# Patient Record
Sex: Female | Born: 1985 | Hispanic: Refuse to answer | Marital: Married
Health system: Southern US, Community
[De-identification: ages and names within clinical notes are randomized; demographics above are authoritative.]

## PROBLEM LIST (undated history)

## (undated) DIAGNOSIS — N390 Urinary tract infection, site not specified: Secondary | ICD-10-CM

## (undated) DIAGNOSIS — E559 Vitamin D deficiency, unspecified: Secondary | ICD-10-CM

## (undated) DIAGNOSIS — Z8619 Personal history of other infectious and parasitic diseases: Secondary | ICD-10-CM

## (undated) HISTORY — DX: Urinary tract infection, site not specified: N39.0

## (undated) HISTORY — DX: Vitamin D deficiency, unspecified: E55.9

## (undated) HISTORY — DX: Personal history of other infectious and parasitic diseases: Z86.19

---

## 2012-11-19 ENCOUNTER — Ambulatory Visit: Payer: Self-pay | Admitting: Family Medicine

## 2013-03-24 ENCOUNTER — Emergency Department: Payer: Self-pay | Admitting: Emergency Medicine

## 2018-07-02 ENCOUNTER — Ambulatory Visit (INDEPENDENT_AMBULATORY_CARE_PROVIDER_SITE_OTHER): Payer: No Typology Code available for payment source | Admitting: Internal Medicine

## 2018-07-02 ENCOUNTER — Encounter: Payer: Self-pay | Admitting: Internal Medicine

## 2018-07-02 VITALS — BP 124/84 | HR 75 | Temp 98.7°F | Ht 64.0 in | Wt 211.6 lb

## 2018-07-02 DIAGNOSIS — Z13228 Encounter for screening for other metabolic disorders: Secondary | ICD-10-CM

## 2018-07-02 DIAGNOSIS — E669 Obesity, unspecified: Secondary | ICD-10-CM

## 2018-07-02 DIAGNOSIS — L309 Dermatitis, unspecified: Secondary | ICD-10-CM

## 2018-07-02 DIAGNOSIS — Z3040 Encounter for surveillance of contraceptives, unspecified: Secondary | ICD-10-CM | POA: Diagnosis not present

## 2018-07-02 DIAGNOSIS — Z3046 Encounter for surveillance of implantable subdermal contraceptive: Secondary | ICD-10-CM | POA: Diagnosis not present

## 2018-07-02 DIAGNOSIS — Z1321 Encounter for screening for nutritional disorder: Secondary | ICD-10-CM

## 2018-07-02 DIAGNOSIS — Z13220 Encounter for screening for lipoid disorders: Secondary | ICD-10-CM

## 2018-07-02 DIAGNOSIS — Z1329 Encounter for screening for other suspected endocrine disorder: Secondary | ICD-10-CM

## 2018-07-02 DIAGNOSIS — E559 Vitamin D deficiency, unspecified: Secondary | ICD-10-CM

## 2018-07-02 DIAGNOSIS — Z1389 Encounter for screening for other disorder: Secondary | ICD-10-CM

## 2018-07-02 DIAGNOSIS — Z0000 Encounter for general adult medical examination without abnormal findings: Secondary | ICD-10-CM

## 2018-07-02 DIAGNOSIS — Z130 Encounter for screening for diseases of the blood and blood-forming organs and certain disorders involving the immune mechanism: Secondary | ICD-10-CM

## 2018-07-02 MED ORDER — PHENTERMINE HCL 15 MG PO TBDP: 15.0000 mg | ORAL_TABLET | ORAL | 0 refills | Status: DC

## 2018-07-02 MED ORDER — TRIAMCINOLONE ACETONIDE 0.1 % EX CREA: 1.0000 "application " | TOPICAL_CREAM | Freq: Two times a day (BID) | CUTANEOUS | 0 refills | Status: DC

## 2018-07-02 NOTE — Progress Notes (Signed)
Pre visit review using our clinic review tool, if applicable. No additional management support is needed unless otherwise documented below in the visit note. 

## 2018-07-02 NOTE — Progress Notes (Addendum)
Chief Complaint  Patient presents with  . Establish Care   New patient  1 .She needs Nexplanon out referred to ob/gyn placed 2 years ago [facility] and wants to disc contraception nexplanon due to be out 07/2018  2. 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 C/o eczema right hand small spot saw dermatology years ago   Review of Systems  Constitutional: Negative for weight loss.  HENT: Negative for hearing loss.   Eyes: Negative for blurred vision.  Respiratory: Negative for shortness of breath.   Cardiovascular: Negative for chest pain.  Gastrointestinal: Negative for abdominal pain.  Musculoskeletal: Negative for falls.  Skin: Positive for rash.  Neurological: Negative for headaches.  Psychiatric/Behavioral: Negative for depression.   Past Medical History:  Diagnosis Date  . History of chicken pox    Past Surgical History:  Procedure Laterality Date  . CESAREAN SECTION     C sec 2004 and 2011    Family History  Problem Relation Age of Onset  . Diabetes Maternal Grandmother   . Hypertension Maternal Grandmother   . Heart disease Maternal Grandfather   . Cancer Paternal Grandfather        lung smoker    Social History   Socioeconomic History  . Marital status: Married    Spouse name: Not on file  . Number of children: Not on file  . Years of education: Not on file  . Highest education level: Not on file  Occupational History  . Not on file  Social Needs  . Financial resource strain: Not on file  . Food insecurity:    Worry: Not on file    Inability: Not on file  . Transportation needs:    Medical: Not on file    Non-medical: Not on file  Tobacco Use  . Smoking status: Never Smoker  . Smokeless tobacco: Never Used  Substance and Sexual Activity  . Alcohol use: Yes  . Drug use: Not Currently  . Sexual activity: Yes    Partners:  Male  Lifestyle  . Physical activity:    Days per week: Not on file    Minutes per session: Not on file  . Stress: Not on file  Relationships  . Social connections:    Talks on phone: Not on file    Gets together: Not on file    Attends religious service: Not on file    Active member of club or organization: Not on file    Attends meetings of clubs or organizations: Not on file    Relationship status: Not on file  . Intimate partner violence:    Fear of current or ex partner: Not on file    Emotionally abused: Not on file    Physically abused: Not on file    Forced sexual activity: Not on file  Other Topics Concern  . 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   No Known Allergies No results found for this or any previous visit (from the past 2160 hour(s)). Objective  Body mass index is 36.32 kg/m. Wt Readings from Last 3 Encounters:  07/02/18 211 lb 9.6 oz (96 kg)   Temp Readings from Last 3 Encounters:  07/02/18 98.7 F (37.1 C) (Oral)   BP Readings from Last 3 Encounters:  07/02/18 124/84   Pulse Readings from Last 3 Encounters:  07/02/18 75    Physical Exam  Constitutional: She is oriented to person, place, and time. Vital signs are normal. She appears well-developed and well-nourished. She is cooperative.  HENT:  Head: Normocephalic and atraumatic.  Mouth/Throat: Oropharynx is clear and moist and mucous membranes are normal.  Eyes: Pupils are equal, round, and reactive to light. Conjunctivae are normal.  Cardiovascular: Normal rate, regular rhythm and normal heart sounds.  Pulmonary/Chest: Effort normal and breath sounds normal.    Neurological: She is alert and oriented to person, place, and time. Gait normal.  Skin: Skin is warm and dry.     Small area hypopigmented eczema right hand   Psychiatric: She has a normal mood and affect. Her speech is normal and behavior is normal. Judgment and thought content normal. Cognition and memory are normal.  Nursing note and vitals reviewed.   Assessment   1. nexplanon and disc contraception  2. Obesity BMI 36.32  3. Eczema  4. HM Plan  1. Refer ob/gyn Dr. Leonides Schanz remove and disc options  2. adipex 15 mg x 1 month call back in 1 month to see if wants to be on 37.5 dose  Will need f/u in 2 months  Healthy diet and exercise  3. tmc and hand cream rec  4.  Flu shot had 05/31/18  Tdap 2017 check [facility]  Declines STD check, MMR and hep B had with job  sch fasting labs  Pap 2 years ago [facility] get records today referred obgyn as above  -health dept cant find record   [facility]  Provider: Dr. Olivia Mackie McLean-Scocuzza-Internal Medicine

## 2018-07-02 NOTE — Patient Instructions (Addendum)
Call health dept see if willing to take out nexplanon  Dr Kari Baars [facility]   Try Biotene mouthwash for mouth dryness    Exercising to Lose Weight Exercising can help you to lose weight. In order to lose weight through exercise, you need to do vigorous-intensity exercise. You can tell that you are exercising with vigorous intensity if you are breathing very hard and fast and cannot hold a conversation while exercising. Moderate-intensity exercise helps to maintain your current weight. You can tell that you are exercising at a moderate level if you have a higher heart rate and faster breathing, but you are still able to hold a conversation. How often should I exercise? Choose an activity that you enjoy and set realistic goals. Your health care provider can help you to make an activity plan that works for you. Exercise regularly as directed by your health care provider. This may include:  Doing resistance training twice each week, such as: ? Push-ups. ? Sit-ups. ? Lifting weights. ? Using resistance bands.  Doing a given intensity of exercise for a given amount of time. Choose from these options: ? 150 minutes of moderate-intensity exercise every week. ? 75 minutes of vigorous-intensity exercise every week. ? A mix of moderate-intensity and vigorous-intensity exercise every week.  Children, pregnant women, people who are out of shape, people who are overweight, and older adults may need to consult a health care provider for individual recommendations. If you have any sort of medical condition, be sure to consult your health care provider before starting a new exercise program. What are some activities that can help me to lose weight?  Walking at a rate of at least 4.5 miles an hour.  Jogging or running at a rate of 5 miles per hour.  Biking at a rate of at least 10 miles per hour.  Lap swimming.  Roller-skating or in-line skating.  Cross-country skiing.  Vigorous competitive sports,  such as football, basketball, and soccer.  Jumping rope.  Aerobic dancing. How can I be more active in my day-to-day activities?  Use the stairs instead of the elevator.  Take a walk during your lunch break.  If you drive, park your car farther away from work or school.  If you take public transportation, get off one stop early and walk the rest of the way.  Make all of your phone calls while standing up and walking around.  Get up, stretch, and walk around every 30 minutes throughout the day. What guidelines should I follow while exercising?  Do not exercise so much that you hurt yourself, feel dizzy, or get very short of breath.  Consult your health care provider prior to starting a new exercise program.  Wear comfortable clothes and shoes with good support.  Drink plenty of water while you exercise to prevent dehydration or heat stroke. Body water is lost during exercise and must be replaced.  Work out until you breathe faster and your heart beats faster. This information is not intended to replace advice given to you by your health care provider. Make sure you discuss any questions you have with your health care provider. Document Released: 09/21/2010 Document Revised: 01/25/2016 Document Reviewed: 01/20/2014 Elsevier Interactive Patient Education  [employer].

## 2018-07-09 ENCOUNTER — Other Ambulatory Visit (INDEPENDENT_AMBULATORY_CARE_PROVIDER_SITE_OTHER): Payer: No Typology Code available for payment source

## 2018-07-09 DIAGNOSIS — Z13220 Encounter for screening for lipoid disorders: Secondary | ICD-10-CM

## 2018-07-09 DIAGNOSIS — Z1329 Encounter for screening for other suspected endocrine disorder: Secondary | ICD-10-CM | POA: Diagnosis not present

## 2018-07-09 DIAGNOSIS — Z0000 Encounter for general adult medical examination without abnormal findings: Secondary | ICD-10-CM | POA: Diagnosis not present

## 2018-07-09 DIAGNOSIS — Z1389 Encounter for screening for other disorder: Secondary | ICD-10-CM

## 2018-07-09 DIAGNOSIS — E559 Vitamin D deficiency, unspecified: Secondary | ICD-10-CM

## 2018-07-09 LAB — LIPID PANEL
Cholesterol: 143 mg/dL (ref 0–200)
HDL: 43.5 mg/dL (ref >39.00)
LDL Cholesterol: 83 mg/dL (ref 0–99)
NONHDL: 99.53
TRIGLYCERIDES: 83 mg/dL (ref 0.0–149.0)
Total CHOL/HDL Ratio: 3
VLDL: 16.6 mg/dL (ref 0.0–40.0)

## 2018-07-09 LAB — CBC WITH DIFFERENTIAL/PLATELET
BASOS ABS: 0.1 10*3/uL (ref 0.0–0.1)
Basophils Relative: 1 % (ref 0.0–3.0)
Eosinophils Absolute: 0.2 10*3/uL (ref 0.0–0.7)
Eosinophils Relative: 2 % (ref 0.0–5.0)
HEMATOCRIT: 41.6 % (ref 36.0–46.0)
Hemoglobin: 14 g/dL (ref 12.0–15.0)
LYMPHS PCT: 32.7 % (ref 12.0–46.0)
Lymphs Abs: 2.8 10*3/uL (ref 0.7–4.0)
MCHC: 33.6 g/dL (ref 30.0–36.0)
MCV: 88.4 fl (ref 78.0–100.0)
Monocytes Absolute: 0.6 10*3/uL (ref 0.1–1.0)
Monocytes Relative: 6.6 % (ref 3.0–12.0)
NEUTROS ABS: 4.9 10*3/uL (ref 1.4–7.7)
NEUTROS PCT: 57.7 % (ref 43.0–77.0)
PLATELETS: 313 10*3/uL (ref 150.0–400.0)
RBC: 4.71 Mil/uL (ref 3.87–5.11)
RDW: 12.7 % (ref 11.5–15.5)
WBC: 8.6 10*3/uL (ref 4.0–10.5)

## 2018-07-09 LAB — COMPREHENSIVE METABOLIC PANEL
ALT: 29 U/L (ref 0–35)
AST: 20 U/L (ref 0–37)
Albumin: 4.2 g/dL (ref 3.5–5.2)
Alkaline Phosphatase: 74 U/L (ref 39–117)
BILIRUBIN TOTAL: 0.5 mg/dL (ref 0.2–1.2)
BUN: 10 mg/dL (ref 6–23)
CO2: 29 meq/L (ref 19–32)
CREATININE: 0.61 mg/dL (ref 0.40–1.20)
Calcium: 9.4 mg/dL (ref 8.4–10.5)
Chloride: 105 mEq/L (ref 96–112)
GFR: 120.78 mL/min (ref >60.00)
Glucose, Bld: 99 mg/dL (ref 70–99)
Potassium: 4.6 mEq/L (ref 3.5–5.1)
Sodium: 140 mEq/L (ref 135–145)
Total Protein: 7.7 g/dL (ref 6.0–8.3)

## 2018-07-09 LAB — T4, FREE: Free T4: 0.75 ng/dL (ref 0.60–1.60)

## 2018-07-09 LAB — TSH: TSH: 0.96 u[IU]/mL (ref 0.35–4.50)

## 2018-07-09 LAB — VITAMIN D 25 HYDROXY (VIT D DEFICIENCY, FRACTURES): VITD: 20.62 ng/mL — ABNORMAL LOW (ref 30.00–100.00)

## 2018-07-09 NOTE — Addendum Note (Signed)
Addended by: Penne Lash on: 07/09/2018 09:27 AM   Modules accepted: Orders

## 2018-07-10 LAB — URINALYSIS, ROUTINE W REFLEX MICROSCOPIC
Bilirubin, UA: NEGATIVE
Glucose, UA: NEGATIVE
Ketones, UA: NEGATIVE
LEUKOCYTES UA: NEGATIVE
Nitrite, UA: NEGATIVE
PROTEIN UA: NEGATIVE
RBC, UA: NEGATIVE
Specific Gravity, UA: 1.017 (ref 1.005–1.030)
UUROB: 0.2 mg/dL (ref 0.2–1.0)
pH, UA: 8 — ABNORMAL HIGH (ref 5.0–7.5)

## 2018-08-19 ENCOUNTER — Telehealth: Payer: Self-pay | Admitting: Internal Medicine

## 2018-08-19 ENCOUNTER — Other Ambulatory Visit: Payer: Self-pay | Admitting: Internal Medicine

## 2018-08-19 DIAGNOSIS — E669 Obesity, unspecified: Secondary | ICD-10-CM

## 2018-08-19 MED ORDER — PHENTERMINE HCL 37.5 MG PO TABS: 37.5000 mg | ORAL_TABLET | Freq: Every day | ORAL | 0 refills | Status: DC

## 2018-08-19 MED ORDER — PHENTERMINE HCL 15 MG PO TBDP: 15.0000 mg | ORAL_TABLET | ORAL | 0 refills | Status: DC

## 2018-08-19 NOTE — Telephone Encounter (Signed)
Copied from CRM [phone]. Topic: Quick Communication - Rx Refill/Question >> Aug 19, 2018 10:13 AM Stephannie LiSimmons, Darnesha Diloreto L, NT wrote: Medication: Phentermine HCl 15 MG TBDP  , patient is requesting a dose age increase per her conversation with the provider   Has the patient contacted their pharmacy? no (Agent: If no, request that the patient contact the pharmacy for the refill. (Agent: If yes, when and what did the pharmacy advise?  Preferred Pharmacy (with phone number or street name [facility] [place] [address] [phone] (Phone) [phone] (Fax)    Agent: Please be advised that RX refills may take up to 3 business days. We ask that you follow-up with your pharmacy.

## 2018-08-28 NOTE — Telephone Encounter (Signed)
Informed patient there was a prescription at the pharmacy for this.

## 2018-08-28 NOTE — Telephone Encounter (Signed)
Patient is calling to check on the status of this.

## 2018-09-16 ENCOUNTER — Encounter: Payer: Self-pay | Admitting: Internal Medicine

## 2018-09-16 ENCOUNTER — Ambulatory Visit (INDEPENDENT_AMBULATORY_CARE_PROVIDER_SITE_OTHER): Payer: No Typology Code available for payment source | Admitting: Internal Medicine

## 2018-09-16 VITALS — BP 110/68 | HR 74 | Temp 98.4°F | Ht 64.0 in | Wt 203.7 lb

## 2018-09-16 DIAGNOSIS — Z0000 Encounter for general adult medical examination without abnormal findings: Secondary | ICD-10-CM | POA: Insufficient documentation

## 2018-09-16 DIAGNOSIS — L740 Miliaria rubra: Secondary | ICD-10-CM

## 2018-09-16 DIAGNOSIS — E559 Vitamin D deficiency, unspecified: Secondary | ICD-10-CM | POA: Insufficient documentation

## 2018-09-16 DIAGNOSIS — Z0001 Encounter for general adult medical examination with abnormal findings: Secondary | ICD-10-CM

## 2018-09-16 NOTE — Progress Notes (Signed)
Pre visit review using our clinic review tool, if applicable. No additional management support is needed unless otherwise documented below in the visit note. 

## 2018-09-16 NOTE — Progress Notes (Signed)
Chief Complaint  Patient presents with  . Follow-up   Annual F/u with sister today  1. Obesity with wt loss of 8 lbs tolerating adipex 37.5 mg qd  2. Vitamin D def not taking vitamin D yet  3. Saw OB/GYN 07/16/18 pap normal and nexplanon out and changed to orthotricycline/trisprintec tolerating.  4. Rash to chest due to sweating at night in her sleep    Review of Systems  Constitutional: Positive for weight loss.  HENT: Negative for hearing loss.   Eyes: Negative for blurred vision.  Respiratory: Negative for shortness of breath.   Cardiovascular: Negative for chest pain.  Gastrointestinal: Negative for abdominal pain.  Musculoskeletal: Negative for falls.  Skin: Positive for rash.  Neurological: Negative for headaches.  Psychiatric/Behavioral: Negative for depression. The patient has insomnia.    Past Medical History:  Diagnosis Date  . History of chicken pox    Past Surgical History:  Procedure Laterality Date  . CESAREAN SECTION     C sec 2004 and 2011    Family History  Problem Relation Age of Onset  . Diabetes Maternal Grandmother   . Hypertension Maternal Grandmother   . Heart disease Maternal Grandfather   . Cancer Paternal Grandfather        lung smoker    Social History   Socioeconomic History  . Marital status: Married    Spouse name: Not on file  . Number of children: Not on file  . Years of education: Not on file  . Highest education level: Not on file  Occupational History  . Not on file  Social Needs  . Financial resource strain: Not on file  . Food insecurity:    Worry: Not on file    Inability: Not on file  . Transportation needs:    Medical: Not on file    Non-medical: Not on file  Tobacco Use  . Smoking status: Never Smoker  . Smokeless tobacco: Never Used  Substance and Sexual Activity  . Alcohol use: Yes  . Drug use: Not Currently  . Sexual activity: Yes    Partners: Male  Lifestyle  . Physical activity:    Days per week: Not on  file    Minutes per session: Not on file  . Stress: Not on file  Relationships  . Social connections:    Talks on phone: Not on file    Gets together: Not on file    Attends religious service: Not on file    Active member of club or organization: Not on file    Attends meetings of clubs or organizations: Not on file    Relationship status: Not on file  . Intimate partner violence:    Fear of current or ex partner: Not on file    Emotionally abused: Not on file    Physically abused: Not on file    Forced sexual activity: Not on file  Other Topics Concern  . 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 phentermine (ADIPEX-P) 37.5 MG tablet Take 1 tablet (37.5 mg total) by mouth daily before breakfast.  . triamcinolone cream (KENALOG)  0.1 % Apply 1 application topically 2 (two) times daily. Right hand as needed   No Known Allergies Recent Results (from the past 2160 hour(s))  Urinalysis, Routine w reflex microscopic     Status: Abnormal   Collection Time: 07/09/18  9:27 AM  Result Value Ref Range   Specific Gravity, UA 1.017 1.005 - 1.030   pH, UA 8.0 (H) 5.0 - 7.5   Color, UA Yellow Yellow   Appearance Ur Clear Clear   Leukocytes, UA Negative Negative   Protein, UA Negative Negative/Trace   Glucose, UA Negative Negative   Ketones, UA Negative Negative   RBC, UA Negative Negative   Bilirubin, UA Negative Negative   Urobilinogen, Ur 0.2 0.2 - 1.0 mg/dL   Nitrite, UA Negative Negative   Microscopic Examination Comment     Comment: Microscopic not indicated and not performed.  Vitamin D (25 hydroxy)     Status: Abnormal   Collection Time: 07/09/18  9:27 AM  Result Value Ref Range   VITD 20.62 (L) 30.00 - 100.00 ng/mL  T4,  free     Status: None   Collection Time: 07/09/18  9:27 AM  Result Value Ref Range   Free T4 0.75 0.60 - 1.60 ng/dL    Comment: Specimens from patients who are undergoing biotin therapy and /or ingesting biotin supplements may contain high levels of biotin.  The higher biotin concentration in these specimens interferes with this Free T4 assay.  Specimens that contain high levels  of biotin may cause false high results for this Free T4 assay.  Please interpret results in light of the total clinical presentation of the patient.    Lipid panel     Status: None   Collection Time: 07/09/18  9:27 AM  Result Value Ref Range   Cholesterol 143 0 - 200 mg/dL    Comment: ATP III Classification       Desirable:  < 200 mg/dL               Borderline High:  200 - 239 mg/dL          High:  > = 240 mg/dL   Triglycerides 83.0 0.0 - 149.0 mg/dL    Comment: Normal:  <150 mg/dLBorderline High:  150 - 199 mg/dL   HDL 43.50 >39.00 mg/dL   VLDL 16.6 0.0 - 40.0 mg/dL   LDL Cholesterol 83 0 - 99 mg/dL   Total CHOL/HDL Ratio 3     Comment:                Men          Women1/2 Average Risk     3.4          3.3Average Risk          5.0          4.42X Average Risk          9.6          7.13X Average Risk          15.0          11.0                       NonHDL 99.53     Comment: NOTE:  Non-HDL goal should be 30 mg/dL higher than patient's LDL goal (i.e. LDL goal of < 70 mg/dL, would have non-HDL goal of < 100 mg/dL)  CBC w/Diff     Status: None  Collection Time: 07/09/18  9:27 AM  Result Value Ref Range   WBC 8.6 4.0 - 10.5 K/uL   RBC 4.71 3.87 - 5.11 Mil/uL   Hemoglobin 14.0 12.0 - 15.0 g/dL   HCT 41.6 36.0 - 46.0 %   MCV 88.4 78.0 - 100.0 fl   MCHC 33.6 30.0 - 36.0 g/dL   RDW 12.7 11.5 - 15.5 %   Platelets 313.0 150.0 - 400.0 K/uL   Neutrophils Relative % 57.7 43.0 - 77.0 %   Lymphocytes Relative 32.7 12.0 - 46.0 %   Monocytes Relative 6.6 3.0 - 12.0 %   Eosinophils Relative 2.0 0.0 - 5.0 %   Basophils  Relative 1.0 0.0 - 3.0 %   Neutro Abs 4.9 1.4 - 7.7 K/uL   Lymphs Abs 2.8 0.7 - 4.0 K/uL   Monocytes Absolute 0.6 0.1 - 1.0 K/uL   Eosinophils Absolute 0.2 0.0 - 0.7 K/uL   Basophils Absolute 0.1 0.0 - 0.1 K/uL  Comprehensive metabolic panel     Status: None   Collection Time: 07/09/18  9:27 AM  Result Value Ref Range   Sodium 140 135 - 145 mEq/L   Potassium 4.6 3.5 - 5.1 mEq/L   Chloride 105 96 - 112 mEq/L   CO2 29 19 - 32 mEq/L   Glucose, Bld 99 70 - 99 mg/dL   BUN 10 6 - 23 mg/dL   Creatinine, Ser 0.61 0.40 - 1.20 mg/dL   Total Bilirubin 0.5 0.2 - 1.2 mg/dL   Alkaline Phosphatase 74 39 - 117 U/L   AST 20 0 - 37 U/L   ALT 29 0 - 35 U/L   Total Protein 7.7 6.0 - 8.3 g/dL   Albumin 4.2 3.5 - 5.2 g/dL   Calcium 9.4 8.4 - 10.5 mg/dL   GFR 120.78 >60.00 mL/min  TSH     Status: None   Collection Time: 07/09/18  9:27 AM  Result Value Ref Range   TSH 0.96 0.35 - 4.50 uIU/mL   Objective  Body mass index is 34.97 kg/m. Wt Readings from Last 3 Encounters:  09/16/18 203 lb 11.2 oz (92.4 kg)  07/02/18 211 lb 9.6 oz (96 kg)   Temp Readings from Last 3 Encounters:  09/16/18 98.4 F (36.9 C) (Oral)  07/02/18 98.7 F (37.1 C) (Oral)   BP Readings from Last 3 Encounters:  09/16/18 110/68  07/02/18 124/84   Pulse Readings from Last 3 Encounters:  09/16/18 74  07/02/18 75    Physical Exam Vitals signs and nursing note reviewed.  Constitutional:      Appearance: Normal appearance. She is well-developed. She is obese.  HENT:     Head: Normocephalic and atraumatic.     Nose: Nose normal.     Mouth/Throat:     Mouth: Mucous membranes are moist.     Pharynx: Oropharynx is clear.  Eyes:     Pupils: Pupils are equal, round, and reactive to light.  Cardiovascular:     Rate and Rhythm: Normal rate and regular rhythm.     Heart sounds: Normal heart sounds.  Pulmonary:     Effort: Pulmonary effort is normal.     Breath sounds: Normal breath sounds.  Skin:    General: Skin is  warm and dry.  Neurological:     General: No focal deficit present.     Mental Status: She is alert and oriented to person, place, and time.     Gait: Gait normal.  Psychiatric:  Attention and Perception: Attention and perception normal.        Mood and Affect: Mood and affect normal.        Speech: Speech normal.        Behavior: Behavior normal. Behavior is cooperative.        Thought Content: Thought content normal.        Cognition and Memory: Cognition and memory normal.        Judgment: Judgment normal.     Assessment   1. Annual  2. Obesity with wt loss BMI 34.97  3. Likely heat rash to chest  Plan   1.  Flu shot had 05/31/18  Tdap ~2017 check [facility]  Declines STD check, MMR and hep B had with job Reviewed labs rec healthy diet choices and exercise  Pap [facility]/16/18 [facility] nexplanon out HPV neg neg pap  2. adipex until 11/18/2018 then 3 month break call back refills x 4 months supply after 3 month break  3. Trial of tea tree, antibacterial soap otc, benzoyl peroxide otc If does not work topical clindamycin and HC 2.5 % in future if itching  Reduce sweating    [facility]  Provider: Dr. Olivia Mackie McLean-Scocuzza-Internal Medicine

## 2018-09-16 NOTE — Patient Instructions (Addendum)
Vitamin D3 5000 IU daily over the counter   Call back summer June or July 2020 if you want to resume Adipex  Tea tree, Cetaphil or Cerave antibacterial soaps liquid or bar  Or OTC benzoyl wash  -if not better call me back   Vitamin D Deficiency Vitamin D deficiency is when your body does not have enough vitamin D. Vitamin D is important to your body for many reasons:  It helps the body to absorb two important minerals, called calcium and phosphorus.  It plays a role in bone health.  It may help to prevent some diseases, such as diabetes and multiple sclerosis.  It plays a role in muscle function, including heart function. You can get vitamin D by:  Eating foods that naturally contain vitamin D.  Eating or drinking milk or other dairy products that have vitamin D added to them.  Taking a vitamin D supplement or a multivitamin supplement that contains vitamin D.  Being in the sun. Your body naturally makes vitamin D when your skin is exposed to sunlight. Your body changes the sunlight into a form of the vitamin that the body can use. If vitamin D deficiency is severe, it can cause a condition in which your bones become soft. In adults, this condition is called osteomalacia. In children, this condition is called rickets. What are the causes? Vitamin D deficiency may be caused by:  Not eating enough foods that contain vitamin D.  Not getting enough sun exposure.  Having certain digestive system diseases that make it difficult for your body to absorb vitamin D. These diseases include Crohn disease, chronic pancreatitis, and cystic fibrosis.  Having a surgery in which a part of the stomach or a part of the small intestine is removed.  Being obese.  Having chronic kidney disease or liver disease. What increases the risk? This condition is more likely to develop in:  Older people.  People who do not spend much time outdoors.  People who live in a long-term care  facility.  People who have had broken bones.  People with weak or thin bones (osteoporosis).  People who have a disease or condition that changes how the body absorbs vitamin D.  People who have dark skin.  People who take certain medicines, such as steroid medicines or certain seizure medicines.  People who are overweight or obese. What are the signs or symptoms? In mild cases of vitamin D deficiency, there may not be any symptoms. If the condition is severe, symptoms may include:  Bone pain.  Muscle pain.  Falling often.  Broken bones caused by a minor injury. How is this diagnosed? This condition is usually diagnosed with a blood test. How is this treated? Treatment for this condition may depend on what caused the condition. Treatment options include:  Taking vitamin D supplements.  Taking a calcium supplement. Your health care provider will suggest what dose is best for you. Follow these instructions at home:  Take medicines and supplements only as told by your health care provider.  Eat foods that contain vitamin D. Choices include: ? Fortified dairy products, cereals, or juices. Fortified means that vitamin D has been added to the food. Check the label on the package to be sure. ? Fatty fish, such as salmon or trout. ? Eggs. ? Oysters.  Do not use a tanning bed.  Maintain a healthy weight. Lose weight, if needed.  Keep all follow-up visits as told by your health care provider. This is important.  Contact a health care provider if:  Your symptoms do not go away.  You feel like throwing up (nausea) or you throw up (vomit).  You have fewer bowel movements than usual or it is difficult for you to have a bowel movement (constipation). This information is not intended to replace advice given to you by your health care provider. Make sure you discuss any questions you have with your health care provider. Document Released: 11/11/2011 Document Revised: 01/31/2016  Document Reviewed: 01/04/2015 Elsevier Interactive Patient Education  2019 [employer].

## 2018-09-23 ENCOUNTER — Ambulatory Visit (INDEPENDENT_AMBULATORY_CARE_PROVIDER_SITE_OTHER): Payer: No Typology Code available for payment source | Admitting: Internal Medicine

## 2018-09-23 ENCOUNTER — Encounter: Payer: Self-pay | Admitting: Internal Medicine

## 2018-09-23 VITALS — BP 112/64 | HR 82 | Temp 98.4°F | Ht 64.0 in | Wt 200.8 lb

## 2018-09-23 DIAGNOSIS — R6889 Other general symptoms and signs: Secondary | ICD-10-CM | POA: Diagnosis not present

## 2018-09-23 DIAGNOSIS — R112 Nausea with vomiting, unspecified: Secondary | ICD-10-CM | POA: Diagnosis not present

## 2018-09-23 DIAGNOSIS — K529 Noninfective gastroenteritis and colitis, unspecified: Secondary | ICD-10-CM

## 2018-09-23 LAB — POC INFLUENZA A&B (BINAX/QUICKVUE)
INFLUENZA A, POC: NEGATIVE
Influenza B, POC: NEGATIVE

## 2018-09-23 MED ORDER — ONDANSETRON HCL 4 MG PO TABS: 4.0000 mg | ORAL_TABLET | Freq: Three times a day (TID) | ORAL | 0 refills | Status: DC | PRN

## 2018-09-23 NOTE — Patient Instructions (Signed)
Call back by Friday if not better   Nausea, Adult Nausea is the feeling that you have an upset stomach or that you are about to vomit. Nausea on its own is not usually a serious concern, but it may be an early sign of a more serious medical problem. As nausea gets worse, it can lead to vomiting. If vomiting develops, or if you are not able to drink enough fluids, you are at risk of becoming dehydrated. Dehydration can make you tired and thirsty, cause you to have a dry mouth, and decrease how often you urinate. Older adults and people with other diseases or a weak disease-fighting system (immune system) are at higher risk for dehydration. The main goals of treating your nausea are:  To relieve your nausea.  To limit repeated nausea episodes.  To prevent vomiting and dehydration. Follow these instructions at home: Watch your symptoms for any changes. Tell your health care provider about them. Follow these instructions as told by your health care provider. Eating and drinking      Take an oral rehydration solution (ORS). This is a drink that is sold at pharmacies and retail stores.  Drink clear fluids slowly and in small amounts as you are able. Clear fluids include water, ice chips, low-calorie sports drinks, and fruit juice that has water added (diluted fruit juice).  Eat bland, easy-to-digest foods in small amounts as you are able. These foods include bananas, applesauce, rice, lean meats, toast, and crackers.  Avoid drinking fluids that contain a lot of sugar or caffeine, such as energy drinks, sports drinks, and soda.  Avoid alcohol.  Avoid spicy or fatty foods. General instructions  Take over-the-counter and prescription medicines only as told by your health care provider.  Rest at home while you recover.  Drink enough fluid to keep your urine pale yellow.  Breathe slowly and deeply when you feel nauseous.  Avoid smelling things that have strong odors.  Wash your hands  often using soap and water. If soap and water are not available, use hand sanitizer.  Make sure that all people in your household wash their hands well and often.  Keep all follow-up visits as told by your health care provider. This is important. Contact a health care provider if:  Your nausea gets worse.  Your nausea does not go away after two days.  You vomit.  You cannot drink fluids without vomiting.  You have any of the following: ? New symptoms. ? A fever. ? A headache. ? Muscle cramps. ? A rash. ? Pain while urinating.  You feel light-headed or dizzy. Get help right away if:  You have pain in your chest, neck, arm, or jaw.  You feel extremely weak or you faint.  You have vomit that is bright red or looks like coffee grounds.  You have bloody or black stools or stools that look like tar.  You have a severe headache, a stiff neck, or both.  You have severe pain, cramping, or bloating in your abdomen.  You have difficulty breathing or are breathing very quickly.  Your heart is beating very quickly.  Your skin feels cold and clammy.  You feel confused.  You have signs of dehydration, such as: ? Dark urine, very little urine, or no urine. ? Cracked lips. ? Dry mouth. ? Sunken eyes. ? Sleepiness. ? Weakness. These symptoms may represent a serious problem that is an emergency. Do not wait to see if the symptoms will go away. Get medical  help right away. Call your local emergency services (911 in the [place] Do not drive yourself to the hospital. Summary  Nausea is the feeling that you have an upset stomach or that you are about to vomit. Nausea on its own is not usually a serious concern, but it may be an early sign of a more serious medical problem.  If vomiting develops, or if you are not able to drink enough fluids, you are at risk of becoming dehydrated.  Follow recommendations for eating and drinking and take over-the-counter and prescription medicines  only as told by your health care provider.  Contact a health care provider right away if your symptoms worsen or you have new symptoms.  Keep all follow-up visits as told by your health care provider. This is important. This information is not intended to replace advice given to you by your health care provider. Make sure you discuss any questions you have with your health care provider. Document Released: 09/26/2004 Document Revised: 01/27/2018 Document Reviewed: 01/27/2018 Elsevier Interactive Patient Education  2019 [employer].  Food Choices to Help Relieve Diarrhea, Adult When you have diarrhea, the foods you eat and your eating habits are very important. Choosing the right foods and drinks can help:  Relieve diarrhea.  Replace lost fluids and nutrients.  Prevent dehydration. What general guidelines should I follow?  Relieving diarrhea  Choose foods with less than 2 g or .07 oz. of fiber per serving.  Limit fats to less than 8 tsp (38 g or 1.34 oz.) a day.  Avoid the following: ? Foods and beverages sweetened with high-fructose corn syrup, honey, or sugar alcohols such as xylitol, sorbitol, and mannitol. ? Foods that contain a lot of fat or sugar. ? Fried, greasy, or spicy foods. ? High-fiber grains, breads, and cereals. ? Raw fruits and vegetables.  Eat foods that are rich in probiotics. These foods include dairy products such as yogurt and fermented milk products. They help increase healthy bacteria in the stomach and intestines (gastrointestinal tract, or GI tract).  If you have lactose intolerance, avoid dairy products. These may make your diarrhea worse.  Take medicine to help stop diarrhea (antidiarrheal medicine) only as told by your health care provider. Replacing nutrients  Eat small meals or snacks every 3-4 hours.  Eat bland foods, such as white rice, toast, or baked potato, until your diarrhea starts to get better. Gradually reintroduce nutrient-rich foods  as tolerated or as told by your health care provider. This includes: ? Well-cooked protein foods. ? Peeled, seeded, and soft-cooked fruits and vegetables. ? Low-fat dairy products.  Take vitamin and mineral supplements as told by your health care provider. Preventing dehydration  Start by sipping water or a special solution to prevent dehydration (oral rehydration solution, ORS). Urine that is clear or pale yellow means that you are getting enough fluid.  Try to drink at least 8-10 cups of fluid each day to help replace lost fluids.  You may add other liquids in addition to water, such as clear juice or decaffeinated sports drinks, as tolerated or as told by your health care provider.  Avoid drinks with caffeine, such as coffee, tea, or soft drinks.  Avoid alcohol. What foods are recommended?     The items listed may not be a complete list. Talk with your health care provider about what dietary choices are best for you. Grains White rice. White, [place], or pita breads (fresh or toasted), including plain rolls, buns, or bagels. White pasta. Saltine,  soda, or graham crackers. Pretzels. Low-fiber cereal. Cooked cereals made with water (such as cornmeal, farina, or cream cereals). Plain muffins. Matzo. Melba toast. Zwieback. Vegetables Potatoes (without the skin). Most well-cooked and canned vegetables without skins or seeds. Tender lettuce. Fruits Apple sauce. Fruits canned in juice. Cooked apricots, cherries, grapefruit, peaches, pears, or plums. Fresh bananas and cantaloupe. Meats and other protein foods Baked or boiled chicken. Eggs. Tofu. Fish. Seafood. Smooth nut butters. Ground or well-cooked tender beef, ham, veal, lamb, pork, or poultry. Dairy Plain yogurt, kefir, and unsweetened liquid yogurt. Lactose-free milk, buttermilk, skim milk, or soy milk. Low-fat or nonfat hard cheese. Beverages Water. Low-calorie sports drinks. Fruit juices without pulp. Strained tomato and vegetable  juices. Decaffeinated teas. Sugar-free beverages not sweetened with sugar alcohols. Oral rehydration solutions, if approved by your health care provider. Seasoning and other foods Bouillon, broth, or soups made from recommended foods. What foods are not recommended? The items listed may not be a complete list. Talk with your health care provider about what dietary choices are best for you. Grains Whole grain, whole wheat, bran, or rye breads, rolls, pastas, and crackers. Wild or brown rice. Whole grain or bran cereals. Barley. Oats and oatmeal. Corn tortillas or taco shells. Granola. Popcorn. Vegetables Raw vegetables. Fried vegetables. Cabbage, broccoli, Brussels sprouts, artichokes, baked beans, beet greens, corn, kale, legumes, peas, sweet potatoes, and yams. Potato skins. Cooked spinach and cabbage. Fruits Dried fruit, including raisins and dates. Raw fruits. Stewed or dried prunes. Canned fruits with syrup. Meat and other protein foods Fried or fatty meats. Deli meats. Chunky nut butters. Nuts and seeds. Beans and lentils. Tomasa Blase. Hot dogs. Sausage. Dairy High-fat cheeses. Whole milk, chocolate milk, and beverages made with milk, such as milk shakes. Half-and-half. Cream. sour cream. Ice cream. Beverages Caffeinated beverages (such as coffee, tea, soda, or energy drinks). Alcoholic beverages. Fruit juices with pulp. Prune juice. Soft drinks sweetened with high-fructose corn syrup or sugar alcohols. High-calorie sports drinks. Fats and oils Butter. Cream sauces. Margarine. Salad oils. Plain salad dressings. Olives. Avocados. Mayonnaise. Sweets and desserts Sweet rolls, doughnuts, and sweet breads. Sugar-free desserts sweetened with sugar alcohols such as xylitol and sorbitol. Seasoning and other foods Honey. Hot sauce. Chili powder. Gravy. Cream-based or milk-based soups. Pancakes and waffles. Summary  When you have diarrhea, the foods you eat and your eating habits are very  important.  Make sure you get at least 8-10 cups of fluid each day, or enough to keep your urine clear or pale yellow.  Eat bland foods and gradually reintroduce healthy, nutrient-rich foods as tolerated, or as told by your health care provider.  Avoid high-fiber, fried, greasy, or spicy foods. This information is not intended to replace advice given to you by your health care provider. Make sure you discuss any questions you have with your health care provider. Document Released: 11/09/2003 Document Revised: 08/16/2016 Document Reviewed: 08/16/2016 Elsevier Interactive Patient Education  2019 [employer].

## 2018-09-23 NOTE — Progress Notes (Signed)
Pre visit review using our clinic review tool, if applicable. No additional management support is needed unless otherwise documented below in the visit note. 

## 2018-09-28 ENCOUNTER — Encounter: Payer: Self-pay | Admitting: Internal Medicine

## 2018-09-28 NOTE — Progress Notes (Signed)
Chief Complaint  Patient presents with  . Nausea  . Emesis   Acute visit  1. Nausea/vomiting worse since Monday prior to visit and feels dehydrated, chills, dizzy. She reports she ate a crab meal at a restaurant in [place] Saturday. She also developed h/a Monday and Tuesday, felt fatigue as well and vomited 1 x Monday. Her thirst has increased. She had projective vomiting at the same time having a stool and heart was racing and she felt pale and weak. She tried pedialyte and water and drank 2 pedialytes. She tried Tylenol 2 pills x 3 times on Tuesday which helped h/a and she reports reduced po intake. No sore throat of body aches. Monday she had abdominal pain which is now resolved. No fever. She tried to go to work today but had to leave early and come to the doctor due to not feeling well.     Review of Systems  Constitutional: Positive for chills and malaise/fatigue. Negative for fever.  HENT: Negative for hearing loss.   Eyes: Negative for blurred vision.  Respiratory: Negative for shortness of breath.   Cardiovascular: Positive for palpitations. Negative for chest pain.  Gastrointestinal: Positive for abdominal pain, nausea and vomiting.  Musculoskeletal: Negative for falls.  Neurological: Positive for dizziness and headaches.  Psychiatric/Behavioral: Negative for memory loss.   Past Medical History:  Diagnosis Date  . History of chicken pox   . Vitamin D deficiency    Past Surgical History:  Procedure Laterality Date  . CESAREAN SECTION     C sec 2004 and 2011    Family History  Problem Relation Age of Onset  . Diabetes Maternal Grandmother   . Hypertension Maternal Grandmother   . Heart disease Maternal Grandfather   . Cancer Paternal Grandfather        lung smoker    Social History   Socioeconomic History  . Marital status: Married    Spouse name: Not on file  . Number of children: Not on file  . Years of education: Not on file  . Highest education level: Not on file   Occupational History  . Not on file  Social Needs  . Financial resource strain: Not on file  . Food insecurity:    Worry: Not on file    Inability: Not on file  . Transportation needs:    Medical: Not on file    Non-medical: Not on file  Tobacco Use  . Smoking status: Never Smoker  . Smokeless tobacco: Never Used  Substance and Sexual Activity  . Alcohol use: Yes  . Drug use: Not Currently  . Sexual activity: Yes    Partners: Male  Lifestyle  . Physical activity:    Days per week: Not on file    Minutes per session: Not on file  . Stress: Not on file  Relationships  . Social connections:    Talks on phone: Not on file    Gets together: Not on file    Attends religious service: Not on file    Active member of club or organization: Not on file    Attends meetings of clubs or organizations: Not on file    Relationship status: Not on file  . Intimate partner violence:    Fear of current or ex partner: Not on file    Emotionally abused: Not on file    Physically abused: Not on file    Forced sexual activity: Not on file  Other Topics Concern  . 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 Norgestimate-Ethinyl Estradiol Triphasic (TRI-SPRINTEC) 0.18/0.215/0.25 MG-35 MCG tablet Take 1 tablet by mouth daily.  . phentermine (ADIPEX-P) 37.5 MG tablet Take 1 tablet (37.5 mg total) by mouth daily before breakfast.  . triamcinolone cream (KENALOG) 0.1 % Apply 1 application topically 2 (two) times daily. Right hand as needed   No Known Allergies Recent Results (from the past 2160 hour(s))  Urinalysis, Routine w reflex microscopic     Status: Abnormal   Collection Time: 07/09/18  9:27 AM  Result Value Ref Range   Specific  Gravity, UA 1.017 1.005 - 1.030   pH, UA 8.0 (H) 5.0 - 7.5   Color, UA Yellow Yellow   Appearance Ur Clear Clear   Leukocytes, UA Negative Negative   Protein, UA Negative Negative/Trace   Glucose, UA Negative Negative   Ketones, UA Negative Negative   RBC, UA Negative Negative   Bilirubin, UA Negative Negative   Urobilinogen, Ur 0.2 0.2 - 1.0 mg/dL   Nitrite, UA Negative Negative   Microscopic Examination Comment     Comment: Microscopic not indicated and not performed.  Vitamin D (25 hydroxy)     Status: Abnormal   Collection Time: 07/09/18  9:27 AM  Result Value Ref Range   VITD 20.62 (L) 30.00 - 100.00 ng/mL  T4, free     Status: None   Collection Time: 07/09/18  9:27 AM  Result Value Ref Range   Free T4 0.75 0.60 - 1.60 ng/dL    Comment: Specimens from patients who are undergoing biotin therapy and /or ingesting biotin supplements may contain high levels of biotin.  The higher biotin concentration in these specimens interferes with this Free T4 assay.  Specimens that contain high levels  of biotin may cause false high results for this Free T4 assay.  Please interpret results in light of the total clinical presentation of the patient.    Lipid panel     Status: None   Collection Time: 07/09/18  9:27 AM  Result Value Ref Range   Cholesterol 143 0 - 200 mg/dL    Comment: ATP III Classification       Desirable:  < 200 mg/dL               Borderline High:  200 - 239 mg/dL          High:  > = 161240 mg/dL   Triglycerides 09.683.0 0.0 - 149.0 mg/dL    Comment: Normal:  <045<150 mg/dLBorderline High:  150 - 199 mg/dL   HDL 40.9843.50 >11.91>39.00 mg/dL   VLDL 47.816.6 0.0 - 29.540.0 mg/dL   LDL Cholesterol 83 0 - 99 mg/dL   Total CHOL/HDL Ratio 3     Comment:                Men          Women1/2 Average Risk     3.4          3.3Average Risk          5.0          4.42X Average Risk  9.6          7.13X Average Risk          15.0          11.0                       NonHDL 99.53     Comment: NOTE:  Non-HDL  goal should be 30 mg/dL higher than patient's LDL goal (i.e. LDL goal of < 70 mg/dL, would have non-HDL goal of < 100 mg/dL)  CBC w/Diff     Status: None   Collection Time: 07/09/18  9:27 AM  Result Value Ref Range   WBC 8.6 4.0 - 10.5 K/uL   RBC 4.71 3.87 - 5.11 Mil/uL   Hemoglobin 14.0 12.0 - 15.0 g/dL   HCT 68.3 41.9 - 62.2 %   MCV 88.4 78.0 - 100.0 fl   MCHC 33.6 30.0 - 36.0 g/dL   RDW 29.7 98.9 - 21.1 %   Platelets 313.0 150.0 - 400.0 K/uL   Neutrophils Relative % 57.7 43.0 - 77.0 %   Lymphocytes Relative 32.7 12.0 - 46.0 %   Monocytes Relative 6.6 3.0 - 12.0 %   Eosinophils Relative 2.0 0.0 - 5.0 %   Basophils Relative 1.0 0.0 - 3.0 %   Neutro Abs 4.9 1.4 - 7.7 K/uL   Lymphs Abs 2.8 0.7 - 4.0 K/uL   Monocytes Absolute 0.6 0.1 - 1.0 K/uL   Eosinophils Absolute 0.2 0.0 - 0.7 K/uL   Basophils Absolute 0.1 0.0 - 0.1 K/uL  Comprehensive metabolic panel     Status: None   Collection Time: 07/09/18  9:27 AM  Result Value Ref Range   Sodium 140 135 - 145 mEq/L   Potassium 4.6 3.5 - 5.1 mEq/L   Chloride 105 96 - 112 mEq/L   CO2 29 19 - 32 mEq/L   Glucose, Bld 99 70 - 99 mg/dL   BUN 10 6 - 23 mg/dL   Creatinine, Ser 9.41 0.40 - 1.20 mg/dL   Total Bilirubin 0.5 0.2 - 1.2 mg/dL   Alkaline Phosphatase 74 39 - 117 U/L   AST 20 0 - 37 U/L   ALT 29 0 - 35 U/L   Total Protein 7.7 6.0 - 8.3 g/dL   Albumin 4.2 3.5 - 5.2 g/dL   Calcium 9.4 8.4 - 74.0 mg/dL   GFR 814.48 >18.56 mL/min  TSH     Status: None   Collection Time: 07/09/18  9:27 AM  Result Value Ref Range   TSH 0.96 0.35 - 4.50 uIU/mL   Objective  Body mass index is 34.47 kg/m. Wt Readings from Last 3 Encounters:  09/23/18 200 lb 12.8 oz (91.1 kg)  09/16/18 203 lb 11.2 oz (92.4 kg)  07/02/18 211 lb 9.6 oz (96 kg)   Temp Readings from Last 3 Encounters:  09/23/18 98.4 F (36.9 C) (Oral)  09/16/18 98.4 F (36.9 C) (Oral)  07/02/18 98.7 F (37.1 C) (Oral)   BP Readings from Last 3 Encounters:  09/23/18 112/64   09/16/18 110/68  07/02/18 124/84   Pulse Readings from Last 3 Encounters:  09/23/18 82  09/16/18 74  07/02/18 75    Physical Exam Vitals signs and nursing note reviewed.  Constitutional:      General: She is awake.     Appearance: Normal appearance. She is well-developed and well-groomed. She is obese.  HENT:     Head: Normocephalic and atraumatic.     Nose: Nose  normal.     Mouth/Throat:     Mouth: Mucous membranes are moist.     Pharynx: Oropharynx is clear.  Eyes:     Conjunctiva/sclera: Conjunctivae normal.     Pupils: Pupils are equal, round, and reactive to light.  Cardiovascular:     Rate and Rhythm: Normal rate and regular rhythm.     Heart sounds: Normal heart sounds. No murmur.  Pulmonary:     Effort: Pulmonary effort is normal.     Breath sounds: Normal breath sounds.  Abdominal:     General: Bowel sounds are normal. There is no distension.     Tenderness: There is no abdominal tenderness.  Skin:    General: Skin is warm and dry.  Neurological:     General: No focal deficit present.     Mental Status: She is alert and oriented to person, place, and time. Mental status is at baseline.     Gait: Gait normal.  Psychiatric:        Attention and Perception: Attention and perception normal.        Mood and Affect: Mood and affect normal.        Speech: Speech normal.        Behavior: Behavior normal. Behavior is cooperative.        Thought Content: Thought content normal.        Cognition and Memory: Cognition and memory normal.        Judgment: Judgment normal.     Assessment   1. Likely gastroenteritis flu negative  Plan   1. Peptobismol, prn zofran, hydration, bland diet  Prn Tylenol  If not better by Friday this week, consider further w/u if not better pt to call back  Note for work   Provider: Dr. French Anaracy [facility]

## 2019-06-22 ENCOUNTER — Other Ambulatory Visit: Payer: Self-pay | Admitting: Internal Medicine

## 2019-06-22 DIAGNOSIS — E669 Obesity, unspecified: Secondary | ICD-10-CM

## 2019-06-22 NOTE — Telephone Encounter (Signed)
Requested medication (s) are due for refill today: yes Requested medication (s) are on the active medication list: yes  Last refill:  08/19/2018  Future visit scheduled: yes  Notes to clinic:  Refill cannot be delegated   Requested Prescriptions  Pending Prescriptions Disp Refills   phentermine (ADIPEX-P) 37.5 MG tablet 90 tablet 0    Sig: Take 1 tablet (37.5 mg total) by mouth daily before breakfast.     Not Delegated - Gastroenterology:  Antiobesity Agents Failed - 06/22/2019 11:27 AM      Failed - This refill cannot be delegated      Passed - Last BP in normal range    BP Readings from Last 1 Encounters:  09/23/18 112/64         Passed - Last Heart Rate in normal range    Pulse Readings from Last 1 Encounters:  09/23/18 82         Passed - Valid encounter within last 12 months    Recent Outpatient Visits          9 months ago [place] McLean-Scocuzza, Nino Glow, MD   9 months ago Annual physical exam   [place] McLean-Scocuzza, Nino Glow, MD   11 months ago Nexplanon removal   [place] McLean-Scocuzza, Nino Glow, MD      Future Appointments            In 2 months McLean-Scocuzza, Nino Glow, MD [place], [facility]

## 2019-06-22 NOTE — Telephone Encounter (Signed)
Medication Refill - Medication: phentermine (ADIPEX-P) 37.5 MG tablet   Preferred Pharmacy (with phone number or street name):  [place] [place] [phone] (Phone) [phone] (Fax)

## 2019-06-25 ENCOUNTER — Other Ambulatory Visit: Payer: Self-pay | Admitting: Internal Medicine

## 2019-06-25 DIAGNOSIS — E669 Obesity, unspecified: Secondary | ICD-10-CM

## 2019-06-25 MED ORDER — PHENTERMINE HCL 37.5 MG PO TABS: 37.5000 mg | ORAL_TABLET | Freq: Every day | ORAL | 0 refills | Status: DC

## 2019-09-15 ENCOUNTER — Telehealth: Payer: Self-pay | Admitting: Internal Medicine

## 2019-09-15 NOTE — Telephone Encounter (Signed)
I called pt twice and left vm to call ofc. °

## 2019-09-17 ENCOUNTER — Ambulatory Visit (INDEPENDENT_AMBULATORY_CARE_PROVIDER_SITE_OTHER): Payer: No Typology Code available for payment source | Admitting: Internal Medicine

## 2019-09-17 ENCOUNTER — Other Ambulatory Visit: Payer: Self-pay

## 2019-09-17 VITALS — Ht 64.0 in | Wt 200.0 lb

## 2019-09-17 DIAGNOSIS — E669 Obesity, unspecified: Secondary | ICD-10-CM | POA: Diagnosis not present

## 2019-09-17 DIAGNOSIS — E559 Vitamin D deficiency, unspecified: Secondary | ICD-10-CM | POA: Diagnosis not present

## 2019-09-17 DIAGNOSIS — F419 Anxiety disorder, unspecified: Secondary | ICD-10-CM

## 2019-09-17 DIAGNOSIS — Z1389 Encounter for screening for other disorder: Secondary | ICD-10-CM

## 2019-09-17 DIAGNOSIS — Z13220 Encounter for screening for lipoid disorders: Secondary | ICD-10-CM

## 2019-09-17 DIAGNOSIS — L309 Dermatitis, unspecified: Secondary | ICD-10-CM

## 2019-09-17 DIAGNOSIS — Z1329 Encounter for screening for other suspected endocrine disorder: Secondary | ICD-10-CM

## 2019-09-17 DIAGNOSIS — Z0000 Encounter for general adult medical examination without abnormal findings: Secondary | ICD-10-CM | POA: Diagnosis not present

## 2019-09-17 MED ORDER — PHENTERMINE HCL 37.5 MG PO TABS: 37.5000 mg | ORAL_TABLET | Freq: Every day | ORAL | 0 refills | Status: DC

## 2019-09-17 MED ORDER — TRIAMCINOLONE ACETONIDE 0.1 % EX CREA: 1.0000 "application " | TOPICAL_CREAM | Freq: Two times a day (BID) | CUTANEOUS | 0 refills | Status: DC

## 2019-09-17 NOTE — Progress Notes (Signed)
Virtual Visit via Video Note  I connected with Janan Halter   on 09/17/19 at 11:15 AM EST by a video enabled telemedicine application and verified that I am speaking with the correct person using two identifiers.  Location patient: home Location provider:work or home office Persons participating in the virtual visit: patient, provider  I discussed the limitations of evaluation and management by telemedicine and the availability of in person appointments. The patient expressed understanding and agreed to proceed.   HPI: 1. Annual doing well wt down to 200 lbs with 3 months adipex 37.5 mg qd  2. Anxiety better trying aromatherapy bath salts, prayer. 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 Vitamin D def taking 2000 iu x 3 pills and mvt advised to reduced to 4000 IU daily D3    ROS: See pertinent positives and negatives per HPI. General: weight down trying  Heent: nl hearing Cv: no chest pain  Lungs: no sob  GI: no GIB Skin: hand eczema+  MSK: no jt pain  Neuro: no h/a  Psych : anxiety improved  GU no issues tolerating ocps    Past Medical History:  Diagnosis Date  . History of chicken pox   . Vitamin D deficiency     Past Surgical History:  Procedure Laterality Date  . CESAREAN SECTION     C sec 34 and 2011     Family History  Problem Relation Age of Onset  . Diabetes Maternal Grandmother   . Hypertension Maternal Grandmother   . Heart disease Maternal Grandfather   . 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  Norgestimate-Ethinyl Estradiol Triphasic (TRI-SPRINTEC) 0.18/0.215/0.25 MG-35 MCG tablet, Take 1 tablet by mouth daily., Disp: , Rfl:  .  ondansetron  (ZOFRAN) 4 MG tablet, Take 1 tablet (4 mg total) by mouth every 8 (eight) hours as needed for nausea or vomiting., Disp: 40 tablet, Rfl: 0 .  phentermine (ADIPEX-P) 37.5 MG tablet, Take 1 tablet (37.5 mg total) by mouth daily before breakfast., Disp: 30 tablet, Rfl: 0 .  triamcinolone cream (KENALOG) 0.1 %, Apply 1 application topically 2 (two) times daily. Right hand as needed, Disp: 454 g, Rfl: 0  EXAM:  VITALS per patient if applicable:  GENERAL: alert, oriented, appears well and in no acute distress  HEENT: atraumatic, conjunttiva clear, no obvious abnormalities on inspection of external nose and ears  NECK: normal movements of the head and neck  LUNGS: on inspection no signs of respiratory distress, breathing rate appears normal, no obvious gross SOB, gasping or wheezing  CV: no obvious cyanosis  MS: moves all visible extremities without noticeable abnormality  PSYCH/NEURO: pleasant and cooperative, no obvious depression or anxiety, speech and thought processing grossly intact  ASSESSMENT AND PLAN:  Discussed the following assessment and plan:  Annual physical exam -  Flu shot had likely at work hospital  Tdap ~2017 check [facility]  Had 1/2 covid vxs 09/2019  Declines STD check, MMR and hep B had with job Reviewed labs rec healthy diet choices and exercise  Pap 07/16/18 [facility]  Neg neg HPV  on OCP prev had nexplanon rec meditation, aromatherapy for anxiety which is now improved  rec D3 4000 IU with MVT vitafusion qd    Eczema of right hand - Plan: triamcinolone cream (KENALOG) 0.1 %  Obesity (BMI 30-39.9) - Plan: phentermine (ADIPEX-P) 37.5 MG tablet x 1 more month then will need 3 month break before another 4 month supply  -we discussed possible serious and likely etiologies, options for evaluation and workup, limitations of telemedicine visit vs in person visit, treatment, treatment risks and precautions. Pt prefers to treat via telemedicine empirically rather then risking  or undertaking an in person visit at this moment. Patient agrees to seek prompt in person care if worsening, new symptoms arise, or if is not improving with treatment.   I discussed the assessment and treatment plan with the patient. The patient was provided an opportunity to ask questions and all were answered. The patient agreed with the plan and demonstrated an understanding of the instructions.   The patient was advised to call back or seek an in-person evaluation if the symptoms worsen or if the condition fails to improve as anticipated.  Time spent 20 minutes  Delorise Jackson, MD

## 2019-10-01 ENCOUNTER — Other Ambulatory Visit (INDEPENDENT_AMBULATORY_CARE_PROVIDER_SITE_OTHER): Payer: No Typology Code available for payment source

## 2019-10-01 ENCOUNTER — Other Ambulatory Visit: Payer: Self-pay

## 2019-10-01 DIAGNOSIS — E559 Vitamin D deficiency, unspecified: Secondary | ICD-10-CM | POA: Diagnosis not present

## 2019-10-01 DIAGNOSIS — Z1329 Encounter for screening for other suspected endocrine disorder: Secondary | ICD-10-CM | POA: Diagnosis not present

## 2019-10-01 DIAGNOSIS — Z0000 Encounter for general adult medical examination without abnormal findings: Secondary | ICD-10-CM | POA: Diagnosis not present

## 2019-10-01 DIAGNOSIS — Z13220 Encounter for screening for lipoid disorders: Secondary | ICD-10-CM | POA: Diagnosis not present

## 2019-10-01 DIAGNOSIS — Z1389 Encounter for screening for other disorder: Secondary | ICD-10-CM

## 2019-10-01 LAB — TSH: TSH: 0.58 u[IU]/mL (ref 0.35–4.50)

## 2019-10-01 LAB — COMPREHENSIVE METABOLIC PANEL
ALT: 28 U/L (ref 0–35)
AST: 21 U/L (ref 0–37)
Albumin: 3.8 g/dL (ref 3.5–5.2)
Alkaline Phosphatase: 65 U/L (ref 39–117)
BUN: 7 mg/dL (ref 6–23)
CO2: 28 mEq/L (ref 19–32)
Calcium: 9 mg/dL (ref 8.4–10.5)
Chloride: 102 mEq/L (ref 96–112)
Creatinine, Ser: 0.54 mg/dL (ref 0.40–1.20)
GFR: 129.8 mL/min (ref >60.00)
Glucose, Bld: 89 mg/dL (ref 70–99)
Potassium: 4.1 mEq/L (ref 3.5–5.1)
Sodium: 137 mEq/L (ref 135–145)
Total Bilirubin: 0.4 mg/dL (ref 0.2–1.2)
Total Protein: 6.8 g/dL (ref 6.0–8.3)

## 2019-10-01 LAB — CBC WITH DIFFERENTIAL/PLATELET
Basophils Absolute: 0.1 10*3/uL (ref 0.0–0.1)
Basophils Relative: 0.8 % (ref 0.0–3.0)
Eosinophils Absolute: 0.3 10*3/uL (ref 0.0–0.7)
Eosinophils Relative: 4 % (ref 0.0–5.0)
HCT: 38.5 % (ref 36.0–46.0)
Hemoglobin: 13 g/dL (ref 12.0–15.0)
Lymphocytes Relative: 47.1 % — ABNORMAL HIGH (ref 12.0–46.0)
Lymphs Abs: 3.2 10*3/uL (ref 0.7–4.0)
MCHC: 33.7 g/dL (ref 30.0–36.0)
MCV: 86.3 fl (ref 78.0–100.0)
Monocytes Absolute: 0.6 10*3/uL (ref 0.1–1.0)
Monocytes Relative: 8.3 % (ref 3.0–12.0)
Neutro Abs: 2.7 10*3/uL (ref 1.4–7.7)
Neutrophils Relative %: 39.8 % — ABNORMAL LOW (ref 43.0–77.0)
Platelets: 347 10*3/uL (ref 150.0–400.0)
RBC: 4.46 Mil/uL (ref 3.87–5.11)
RDW: 12.6 % (ref 11.5–15.5)
WBC: 6.7 10*3/uL (ref 4.0–10.5)

## 2019-10-01 LAB — LIPID PANEL
Cholesterol: 161 mg/dL (ref 0–200)
HDL: 52.9 mg/dL (ref >39.00)
LDL Cholesterol: 87 mg/dL (ref 0–99)
NonHDL: 107.75
Total CHOL/HDL Ratio: 3
Triglycerides: 103 mg/dL (ref 0.0–149.0)
VLDL: 20.6 mg/dL (ref 0.0–40.0)

## 2019-10-01 LAB — VITAMIN D 25 HYDROXY (VIT D DEFICIENCY, FRACTURES): VITD: 27.93 ng/mL — ABNORMAL LOW (ref 30.00–100.00)

## 2019-10-12 ENCOUNTER — Encounter (INDEPENDENT_AMBULATORY_CARE_PROVIDER_SITE_OTHER): Payer: No Typology Code available for payment source | Admitting: Internal Medicine

## 2019-10-12 ENCOUNTER — Telehealth: Payer: Self-pay | Admitting: Internal Medicine

## 2019-10-12 DIAGNOSIS — F419 Anxiety disorder, unspecified: Secondary | ICD-10-CM

## 2019-10-12 NOTE — Telephone Encounter (Signed)
Pt called to let Dr. French Ana know that her anxiety is not any better and would like to start a medication

## 2019-10-13 ENCOUNTER — Other Ambulatory Visit: Payer: Self-pay | Admitting: Internal Medicine

## 2019-10-13 DIAGNOSIS — F419 Anxiety disorder, unspecified: Secondary | ICD-10-CM

## 2019-10-13 MED ORDER — HYDROXYZINE HCL 25 MG PO TABS: 25.0000 mg | ORAL_TABLET | Freq: Every evening | ORAL | 0 refills | Status: DC | PRN

## 2019-10-13 MED ORDER — CITALOPRAM HYDROBROMIDE 10 MG PO TABS: 10.0000 mg | ORAL_TABLET | Freq: Every day | ORAL | 3 refills | Status: DC

## 2019-10-13 NOTE — Telephone Encounter (Signed)
Celexa daily 10mg  in the am and add short term Hydroxyzine may work good for me.   Thank you Doctor       Visit Follow-Up Question  Shemeka, [place]  You 12 hours ago (7:07 PM)  VM Yes i agree,   Celexa daily 10mg  in the am  and add short term Hydroxyzine  may work good for me.   Thank you Doctor   You  Sharona, [place] 13 hours ago (6:23 PM)  TM Are you agreeable for a my chart consult fee for this treatment of anxiety since new medication will be started?   Options a low dose celexa daily 10 mg daily to start in the am    And   either add short term (1 of 3 options below) Hydroxyzine which is allergy medication used for anxiety/nonaddictive but can make you sleep at night or when not at work   Or   As needed xanax which can be addictive and make you sleepy   Or   Buspar which is non addictive helps with anxiety but it is taken 2-3 x per day    Let me know what you would like to do please   TMS   Pamalee Leyden T, CMA routed conversation to You 15 hours ago (4:10 PM)  [place], Shanterria Trilby  You 15 hours ago (4:00 PM)  VM Hi Dr.McLean,  We discussed my anxiety in last virtual visit.  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  Can you pls prescribe me a low dose of anti anxiety medication.  You may give me a call or if i need to make an appointment please let me know.  Thank you    A/P 1. Anxiety  celexa 10 mg and qhs atarax 25-50 mg qhs  Agreeable to fee  tms  Time 5-10 min

## 2019-10-14 MED FILL — hydrOXYzine HCL 25 MG TABS: 25 | 15 days supply | Qty: 30 | Fill #0

## 2019-10-14 MED FILL — CITALOPRAM HBR 10 MG TABLET: 10 | 90 days supply | Qty: 90 | Fill #0

## 2020-01-07 MED FILL — CITALOPRAM HBR 10 MG TABLET: 10 | 90 days supply | Qty: 90 | Fill #1

## 2020-01-07 MED FILL — TRI FEMYNOR 28 TABLET: 0.18/0.215/ | 84 days supply | Qty: 84 | Fill #0

## 2020-03-21 ENCOUNTER — Encounter: Payer: Self-pay | Admitting: Internal Medicine

## 2020-03-21 ENCOUNTER — Ambulatory Visit (INDEPENDENT_AMBULATORY_CARE_PROVIDER_SITE_OTHER): Payer: No Typology Code available for payment source | Admitting: Internal Medicine

## 2020-03-21 ENCOUNTER — Other Ambulatory Visit: Payer: Self-pay

## 2020-03-21 VITALS — BP 124/80 | HR 78 | Temp 97.9°F | Ht 64.0 in | Wt 210.8 lb

## 2020-03-21 DIAGNOSIS — R5383 Other fatigue: Secondary | ICD-10-CM

## 2020-03-21 DIAGNOSIS — E559 Vitamin D deficiency, unspecified: Secondary | ICD-10-CM

## 2020-03-21 DIAGNOSIS — R110 Nausea: Secondary | ICD-10-CM

## 2020-03-21 DIAGNOSIS — K760 Fatty (change of) liver, not elsewhere classified: Secondary | ICD-10-CM

## 2020-03-21 DIAGNOSIS — R109 Unspecified abdominal pain: Secondary | ICD-10-CM

## 2020-03-21 DIAGNOSIS — E538 Deficiency of other specified B group vitamins: Secondary | ICD-10-CM | POA: Diagnosis not present

## 2020-03-21 DIAGNOSIS — E669 Obesity, unspecified: Secondary | ICD-10-CM

## 2020-03-21 LAB — CBC WITH DIFFERENTIAL/PLATELET
Basophils Absolute: 0 10*3/uL (ref 0.0–0.1)
Basophils Relative: 0.6 % (ref 0.0–3.0)
Eosinophils Absolute: 0.2 10*3/uL (ref 0.0–0.7)
Eosinophils Relative: 1.8 % (ref 0.0–5.0)
HCT: 38.2 % (ref 36.0–46.0)
Hemoglobin: 12.9 g/dL (ref 12.0–15.0)
Lymphocytes Relative: 31.3 % (ref 12.0–46.0)
Lymphs Abs: 2.7 10*3/uL (ref 0.7–4.0)
MCHC: 33.9 g/dL (ref 30.0–36.0)
MCV: 86.4 fl (ref 78.0–100.0)
Monocytes Absolute: 0.5 10*3/uL (ref 0.1–1.0)
Monocytes Relative: 5.8 % (ref 3.0–12.0)
Neutro Abs: 5.2 10*3/uL (ref 1.4–7.7)
Neutrophils Relative %: 60.5 % (ref 43.0–77.0)
Platelets: 309 10*3/uL (ref 150.0–400.0)
RBC: 4.42 Mil/uL (ref 3.87–5.11)
RDW: 12.6 % (ref 11.5–15.5)
WBC: 8.6 10*3/uL (ref 4.0–10.5)

## 2020-03-21 LAB — COMPREHENSIVE METABOLIC PANEL
ALT: 14 U/L (ref 0–35)
AST: 14 U/L (ref 0–37)
Albumin: 3.8 g/dL (ref 3.5–5.2)
Alkaline Phosphatase: 59 U/L (ref 39–117)
BUN: 10 mg/dL (ref 6–23)
CO2: 26 mEq/L (ref 19–32)
Calcium: 9 mg/dL (ref 8.4–10.5)
Chloride: 103 mEq/L (ref 96–112)
Creatinine, Ser: 0.56 mg/dL (ref 0.40–1.20)
GFR: 124.11 mL/min (ref >60.00)
Glucose, Bld: 88 mg/dL (ref 70–99)
Potassium: 3.8 mEq/L (ref 3.5–5.1)
Sodium: 136 mEq/L (ref 135–145)
Total Bilirubin: 0.3 mg/dL (ref 0.2–1.2)
Total Protein: 7.4 g/dL (ref 6.0–8.3)

## 2020-03-21 LAB — T4, FREE: Free T4: 0.68 ng/dL (ref 0.60–1.60)

## 2020-03-21 LAB — TSH: TSH: 0.63 u[IU]/mL (ref 0.35–4.50)

## 2020-03-21 LAB — VITAMIN B12: Vitamin B-12: 145 pg/mL — ABNORMAL LOW (ref 211–911)

## 2020-03-21 LAB — VITAMIN D 25 HYDROXY (VIT D DEFICIENCY, FRACTURES): VITD: 24.43 ng/mL — ABNORMAL LOW (ref 30.00–100.00)

## 2020-03-21 LAB — POCT URINE PREGNANCY: Preg Test, Ur: NEGATIVE

## 2020-03-21 NOTE — Progress Notes (Signed)
Patient presenting with left flank discomfort. States this has been on and off for some time now. Patient works as a Engineer, civil (consulting) and is unsure if she may have strained herself. No known injuries or over exertion. No pain just discomfort.   Patient flagged: Current status:  PATIENT IS OVERDUE FOR BMI FOLLOW UP PLAN BMI is estimated to be 36.2 based on the last recorded weight and height

## 2020-03-21 NOTE — Patient Instructions (Addendum)
D3 4000 to 5000 IU daily     Abdominal Pain, Adult Pain in the abdomen (abdominal pain) can be caused by many things. Often, abdominal pain is not serious and it gets better with no treatment or by being treated at home. However, sometimes abdominal pain is serious. Your health care provider will ask questions about your medical history and do a physical exam to try to determine the cause of your abdominal pain. Follow these instructions at home:  Medicines  Take over-the-counter and prescription medicines only as told by your health care provider.  Do not take a laxative unless told by your health care provider. General instructions  Watch your condition for any changes.  Drink enough fluid to keep your urine pale yellow.  Keep all follow-up visits as told by your health care provider. This is important. Contact a health care provider if:  Your abdominal pain changes or gets worse.  You are not hungry or you lose weight without trying.  You are constipated or have diarrhea for more than 2-3 days.  You have pain when you urinate or have a bowel movement.  Your abdominal pain wakes you up at night.  Your pain gets worse with meals, after eating, or with certain foods.  You are vomiting and cannot keep anything down.  You have a fever.  You have blood in your urine. Get help right away if:  Your pain does not go away as soon as your health care provider told you to expect.  You cannot stop vomiting.  Your pain is only in areas of the abdomen, such as the right side or the left lower portion of the abdomen. Pain on the right side could be caused by appendicitis.  You have bloody or black stools, or stools that look like tar.  You have severe pain, cramping, or bloating in your abdomen.  You have signs of dehydration, such as: ? Dark urine, very little urine, or no urine. ? Cracked lips. ? Dry mouth. ? Sunken eyes. ? Sleepiness. ? Weakness.  You have trouble  breathing or chest pain. Summary  Often, abdominal pain is not serious and it gets better with no treatment or by being treated at home. However, sometimes abdominal pain is serious.  Watch your condition for any changes.  Take over-the-counter and prescription medicines only as told by your health care provider.  Contact a health care provider if your abdominal pain changes or gets worse.  Get help right away if you have severe pain, cramping, or bloating in your abdomen. This information is not intended to replace advice given to you by your health care provider. Make sure you discuss any questions you have with your health care provider. Document Revised: 12/28/2018 Document Reviewed: 12/28/2018 Elsevier Patient Education  2020 [employer].  Food Choices for Gastroesophageal Reflux Disease, Adult When you have gastroesophageal reflux disease (GERD), the foods you eat and your eating habits are very important. Choosing the right foods can help ease the discomfort of GERD. Consider working with a diet and nutrition specialist (dietitian) to help you make healthy food choices. What general guidelines should I follow?  Eating plan  Choose healthy foods low in fat, such as fruits, vegetables, whole grains, low-fat dairy products, and lean meat, fish, and poultry.  Eat frequent, small meals instead of three large meals each day. Eat your meals slowly, in a relaxed setting. Avoid bending over or lying down until 2-3 hours after eating.  Limit high-fat foods such  as fatty meats or fried foods.  Limit your intake of oils, butter, and shortening to less than 8 teaspoons each day.  Avoid the following: ? Foods that cause symptoms. These may be different for different people. Keep a food diary to keep track of foods that cause symptoms. ? Alcohol. ? Drinking large amounts of liquid with meals. ? Eating meals during the 2-3 hours before bed.  Cook foods using methods other than frying.  This may include baking, grilling, or broiling. Lifestyle  Maintain a healthy weight. Ask your health care provider what weight is healthy for you. If you need to lose weight, work with your health care provider to do so safely.  Exercise for at least 30 minutes on 5 or more days each week, or as told by your health care provider.  Avoid wearing clothes that fit tightly around your waist and chest.  Do not use any products that contain nicotine or tobacco, such as cigarettes and e-cigarettes. If you need help quitting, ask your health care provider.  Sleep with the head of your bed raised. Use a wedge under the mattress or blocks under the bed frame to raise the head of the bed. What foods are not recommended? The items listed may not be a complete list. Talk with your dietitian about what dietary choices are best for you. Grains Pastries or quick breads with added fat. [place] toast. Vegetables Deep fried vegetables. [place] fries. Any vegetables prepared with added fat. Any vegetables that cause symptoms. For some people this may include tomatoes and tomato products, chili peppers, onions and garlic, and horseradish. Fruits Any fruits prepared with added fat. Any fruits that cause symptoms. For some people this may include citrus fruits, such as oranges, grapefruit, pineapple, and lemons. Meats and other protein foods High-fat meats, such as fatty beef or pork, hot dogs, ribs, ham, sausage, salami and bacon. Fried meat or protein, including fried fish and fried chicken. Nuts and nut butters. Dairy Whole milk and chocolate milk. Sour cream. Cream. Ice cream. Cream cheese. Milk shakes. Beverages Coffee and tea, with or without caffeine. Carbonated beverages. Sodas. Energy drinks. Fruit juice made with acidic fruits (such as orange or grapefruit). Tomato juice. Alcoholic drinks. Fats and oils Butter. Margarine. Shortening. Ghee. Sweets and desserts Chocolate and cocoa. Donuts. Seasoning  and other foods Pepper. Peppermint and spearmint. Any condiments, herbs, or seasonings that cause symptoms. For some people, this may include curry, hot sauce, or vinegar-based salad dressings. Summary  When you have gastroesophageal reflux disease (GERD), food and lifestyle choices are very important to help ease the discomfort of GERD.  Eat frequent, small meals instead of three large meals each day. Eat your meals slowly, in a relaxed setting. Avoid bending over or lying down until 2-3 hours after eating.  Limit high-fat foods such as fatty meat or fried foods. This information is not intended to replace advice given to you by your health care provider. Make sure you discuss any questions you have with your health care provider. Document Revised: 12/10/2018 Document Reviewed: 08/20/2016 Elsevier Patient Education  2020 [employer].

## 2020-03-21 NOTE — Progress Notes (Signed)
Chief Complaint  Patient presents with  . Follow-up   F/u  1. C/o fatigue vit D def not consistent taking vitamin D 2. C/l left upper quadrant, mid quadrant ab pain "intercostal worse with increased anxiety at work pain 5-/7/10 intermittent nothing tried at times was associated nausea w/o vomiting. Sister felt and felt a mass at times she still feels hungry though she has eaten and she had nausea x 3 days tried prilosec otc x 2 doses with some relief. 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  HENT: Negative for hearing loss.   Eyes: Negative for blurred vision.  Respiratory: Negative for shortness of breath.   Cardiovascular: Negative for chest pain.  Gastrointestinal: Positive for abdominal pain and nausea.  Genitourinary: Negative for dysuria.  Musculoskeletal: Negative for falls.  Skin: Negative for rash.  Neurological: Negative for headaches.  Psychiatric/Behavioral: Negative for depression.   Past Medical History:  Diagnosis Date  . History of chicken pox   . Vitamin D deficiency    Past Surgical History:  Procedure Laterality Date  . CESAREAN SECTION     C sec 2004 and 2011    Family History  Problem Relation Age of Onset  . Diabetes Maternal Grandmother   . Hypertension Maternal Grandmother   . Heart disease Maternal Grandfather   . Cancer Paternal Grandfather        lung smoker    Social History   Socioeconomic History  . Marital status: Married    Spouse name: Not on file  . Number of children: Not on file  . Years of education: Not on file  . Highest education level: Not on file  Occupational History  . Not on file  Tobacco Use  . Smoking status: Never Smoker  . Smokeless tobacco: Never Used  Substance and Sexual Activity  . Alcohol use: Yes  . Drug use: Not Currently  . Sexual activity: Yes    Partners: Male  Other Topics Concern  . 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 Difficulty of Paying Living Expenses:   Food Insecurity:   . Worried About Charity fundraiser in the Last Year:   . Arboriculturist in the Last Year:   Transportation Needs:   . Film/video editor (Medical):   Marland Kitchen Lack of Transportation (Non-Medical):   Physical Activity:   . Days of Exercise per Week:   . Minutes of Exercise per Session:   Stress:   . Feeling of Stress :   Social Connections:   . Frequency of Communication with Friends and Family:   . Frequency of Social Gatherings with Friends and Family:   . Attends Religious Services:   . Active Member of Clubs or Organizations:   . Attends Archivist Meetings:   Marland Kitchen Marital Status:   Intimate Partner Violence:   . Fear of Current or Ex-Partner:   . Emotionally Abused:   Marland Kitchen Physically Abused:   . Sexually  Abused:    Current Meds  Medication Sig  . citalopram (CELEXA) 10 MG tablet Take 1 tablet (10 mg total) by mouth daily. In am  . hydrOXYzine (ATARAX/VISTARIL) 25 MG tablet Take 1-2 tablets (25-50 mg total) by mouth at bedtime as needed.  . Norgestimate-Ethinyl Estradiol Triphasic (TRI-SPRINTEC) 0.18/0.215/0.25 MG-35 MCG tablet Take 1 tablet by mouth daily.  . ondansetron (ZOFRAN) 4 MG tablet Take 1 tablet (4 mg total) by mouth every 8 (eight) hours as needed for nausea or vomiting.  . phentermine (ADIPEX-P) 37.5 MG tablet Take 1 tablet (37.5 mg total) by mouth daily before breakfast.  . triamcinolone cream (KENALOG) 0.1 % Apply 1 application topically 2 (two) times daily. Right hand as needed   No Known Allergies No results found for this or any previous visit (from the past 2160 hour(s)). Objective  Body mass  index is 36.18 kg/m. Wt Readings from Last 3 Encounters:  03/21/20 210 lb 12.8 oz (95.6 kg)  09/17/19 200 lb (90.7 kg)  09/23/18 200 lb 12.8 oz (91.1 kg)   Temp Readings from Last 3 Encounters:  03/21/20 97.9 F (36.6 C) (Oral)  09/23/18 98.4 F (36.9 C) (Oral)  09/16/18 98.4 F (36.9 C) (Oral)   BP Readings from Last 3 Encounters:  03/21/20 124/80  09/23/18 112/64  09/16/18 110/68   Pulse Readings from Last 3 Encounters:  03/21/20 78  09/23/18 82  09/16/18 74    Physical Exam Vitals and nursing note reviewed.  Constitutional:      Appearance: Normal appearance. She is well-developed and well-groomed. She is obese.  HENT:     Head: Normocephalic and atraumatic.  Eyes:     Conjunctiva/sclera: Conjunctivae normal.     Pupils: Pupils are equal, round, and reactive to light.  Cardiovascular:     Rate and Rhythm: Normal rate and regular rhythm.     Heart sounds: Normal heart sounds. No murmur heard.   Pulmonary:     Effort: Pulmonary effort is normal.     Breath sounds: Normal breath sounds.  Abdominal:     General: Abdomen is flat. Bowel sounds are normal.     Tenderness: There is abdominal tenderness in the left upper quadrant. There is no right CVA tenderness or left CVA tenderness.       Comments: LUQ LMQ moderate ttp  Skin:    General: Skin is warm and dry.  Neurological:     General: No focal deficit present.     Mental Status: She is alert and oriented to person, place, and time. Mental status is at baseline.     Gait: Gait normal.  Psychiatric:        Attention and Perception: Attention and perception normal.        Mood and Affect: Mood and affect normal.        Speech: Speech normal.        Behavior: Behavior normal. Behavior is cooperative.        Thought Content: Thought content normal.        Cognition and Memory: Cognition and memory normal.        Judgment: Judgment normal.     Assessment  Plan  Left sided abdominal pain of unknown cause -  Plan: CT Abdomen Pelvis Wo Contrast, Comprehensive metabolic panel, CBC with Differential/Platelet, Urinalysis, Routine w reflex microscopic, POCT urine pregnancy  Left sided abdominal pain - Plan: CT Abdomen Pelvis Wo Contrast, Comprehensive metabolic panel, CBC with Differential/Platelet, Urinalysis, Routine w reflex microscopic, POCT urine  pregnancy  Fatigue, unspecified type - Plan: Comprehensive metabolic panel, CBC with Differential/Platelet, TSH, T4, free, Vitamin D (25 hydroxy), Vitamin B12  Vitamin D deficiency - Plan: Vitamin D (25 hydroxy) rec D3 4000 to 5000 IU qd   B12 deficiency - Plan: Vitamin B12  Obesity (BMI 30-39.9)  Disc adipex restart vs wegovy  HM Flu shot had likely at work hospital  Tdap~2017 check [facility]  Had 2/2 covid pfizer will sent  Declines STD check, MMR and hep B had with job Reviewed labs rec healthy diet choices and exercise  Pap 07/16/18 [facility]  Neg neg HPV on OCP prev had nexplanon rec meditation, aromatherapy for anxiety which is now improved  rec D3 4000 IU with MVT vitafusion qd   Provider: Dr. Olivia Mackie McLean-[facility]ocuzza-[facility]

## 2020-03-22 ENCOUNTER — Telehealth: Payer: Self-pay | Admitting: Internal Medicine

## 2020-03-22 LAB — URINALYSIS, ROUTINE W REFLEX MICROSCOPIC
Bilirubin Urine: NEGATIVE
Glucose, UA: NEGATIVE
Hgb urine dipstick: NEGATIVE
Ketones, ur: NEGATIVE
Leukocytes,Ua: NEGATIVE
Nitrite: NEGATIVE
Protein, ur: NEGATIVE
Specific Gravity, Urine: 1.006 (ref 1.001–1.03)
pH: 7 (ref 5.0–8.0)

## 2020-03-22 NOTE — Telephone Encounter (Signed)
lft vm for pt to call ofc to sch CT. 

## 2020-03-22 NOTE — Addendum Note (Signed)
Addended by: Quentin Ore on: 03/22/2020 03:10 PM   Modules accepted: Orders

## 2020-03-22 NOTE — Telephone Encounter (Signed)
lft vm with appt date time location and instruction for CT

## 2020-03-28 ENCOUNTER — Telehealth: Payer: Self-pay | Admitting: Internal Medicine

## 2020-03-28 NOTE — Telephone Encounter (Signed)
Pt would like a call back about lab results ..  °

## 2020-03-29 ENCOUNTER — Other Ambulatory Visit: Payer: Self-pay

## 2020-03-29 ENCOUNTER — Encounter: Payer: Self-pay | Admitting: Internal Medicine

## 2020-03-29 ENCOUNTER — Ambulatory Visit
Admission: RE | Admit: 2020-03-29 | Discharge: 2020-03-29 | Disposition: A | Discharge status: Home / Self Care | Payer: No Typology Code available for payment source | Source: Ambulatory Visit | Attending: Internal Medicine | Admitting: Internal Medicine

## 2020-03-29 DIAGNOSIS — R110 Nausea: Secondary | ICD-10-CM | POA: Insufficient documentation

## 2020-03-29 DIAGNOSIS — R109 Unspecified abdominal pain: Secondary | ICD-10-CM | POA: Insufficient documentation

## 2020-03-29 DIAGNOSIS — K760 Fatty (change of) liver, not elsewhere classified: Secondary | ICD-10-CM | POA: Insufficient documentation

## 2020-03-29 MED ORDER — IOHEXOL 300 MG/ML  SOLN
100.0000 mL | Freq: Once | INTRAMUSCULAR | Status: AC | PRN
  Administered 2020-03-29: 100 mL via INTRAVENOUS

## 2020-03-29 NOTE — Telephone Encounter (Signed)
-----   Message from Bevelyn Buckles, MD sent at 03/22/2020  8:02 AM EDT ----- Vitamin D low rec D3 4000 to max 5000 IU daily otc  Liver kidneys normal  B12 low rec B12 every 30 days could be reason for fatigue -will she do it here or give to herself?  If not schedule B12 inj in office   Thyroid lab normal  Blood cts normal Urine normal

## 2020-03-29 NOTE — Telephone Encounter (Signed)
Left message to return call 

## 2020-03-30 NOTE — Telephone Encounter (Signed)
Pt was returning call 11:45

## 2020-03-30 NOTE — Telephone Encounter (Signed)
Patient informed and verbalized understanding.  Scheduled for nurse visit

## 2020-03-30 NOTE — Telephone Encounter (Signed)
Left message to return call 

## 2020-03-30 NOTE — Addendum Note (Signed)
Addended by: Quentin Ore on: 03/30/2020 04:08 PM   Modules accepted: Orders

## 2020-03-31 NOTE — Telephone Encounter (Signed)
Faxed certification process done on patient 7-21 for CT ABD &Pelvis w/o contrast

## 2020-03-31 NOTE — Telephone Encounter (Signed)
Faxed certification process done on patient 7-21 for CT ABD & Pelvis W/O C

## 2020-04-04 ENCOUNTER — Ambulatory Visit (INDEPENDENT_AMBULATORY_CARE_PROVIDER_SITE_OTHER): Payer: No Typology Code available for payment source

## 2020-04-04 ENCOUNTER — Other Ambulatory Visit: Payer: Self-pay

## 2020-04-04 DIAGNOSIS — E538 Deficiency of other specified B group vitamins: Secondary | ICD-10-CM

## 2020-04-04 MED ORDER — CYANOCOBALAMIN 1000 MCG/ML IJ SOLN
1000.0000 ug | Freq: Once | INTRAMUSCULAR | Status: AC
  Administered 2020-04-04: 1000 ug via INTRAMUSCULAR

## 2020-04-04 NOTE — Progress Notes (Signed)
Patient presented for B 12 injection to left deltoid, patient voiced no concerns nor showed any signs of distress during injection. 

## 2020-04-05 ENCOUNTER — Ambulatory Visit (INDEPENDENT_AMBULATORY_CARE_PROVIDER_SITE_OTHER): Payer: No Typology Code available for payment source | Admitting: Internal Medicine

## 2020-04-05 ENCOUNTER — Encounter: Payer: Self-pay | Admitting: Internal Medicine

## 2020-04-05 VITALS — BP 130/80 | HR 71 | Temp 98.1°F | Ht 64.0 in | Wt 209.8 lb

## 2020-04-05 DIAGNOSIS — E538 Deficiency of other specified B group vitamins: Secondary | ICD-10-CM

## 2020-04-05 DIAGNOSIS — R109 Unspecified abdominal pain: Secondary | ICD-10-CM

## 2020-04-05 DIAGNOSIS — E559 Vitamin D deficiency, unspecified: Secondary | ICD-10-CM | POA: Diagnosis not present

## 2020-04-05 DIAGNOSIS — K760 Fatty (change of) liver, not elsewhere classified: Secondary | ICD-10-CM

## 2020-04-05 NOTE — Progress Notes (Signed)
Chief Complaint  Patient presents with  . Follow-up   F/u  1. Ab pain improved after making diet changes and reduced sodas, fast food and CT negative except fatty liver will cancel GI Dr. Allen Norris for now and call back prn. Having intermittent nausea with acidic or spicy foods  2. B12 and vitamin D def on D3 5000 Iu qd and B12 weekly x 1 month then Q30 days  Review of Systems  Constitutional: Negative for weight loss.  HENT: Negative for hearing loss.   Eyes: Negative for blurred vision.  Respiratory: Negative for shortness of breath.   Cardiovascular: Negative for chest pain.  Gastrointestinal: Positive for nausea. Negative for abdominal pain.  Musculoskeletal: Negative for falls.  Skin: Negative for rash.  Neurological: Negative for headaches.   Past Medical History:  Diagnosis Date  . History of chicken pox   . Vitamin D deficiency    Past Surgical History:  Procedure Laterality Date  . CESAREAN SECTION     C sec 2004 and 2011    Family History  Problem Relation Age of Onset  . Diabetes Maternal Grandmother   . Hypertension Maternal Grandmother   . Heart disease Maternal Grandfather   . Cancer Paternal Grandfather        lung smoker    Social History   Socioeconomic History  . Marital status: Married    Spouse name: Not on file  . Number of children: Not on file  . Years of education: Not on file  . Highest education level: Not on file  Occupational History  . Not on file  Tobacco Use  . Smoking status: Never Smoker  . Smokeless tobacco: Never Used  Substance and Sexual Activity  . Alcohol use: Yes  . Drug use: Not Currently  . Sexual activity: Yes    Partners: Male  Other Topics Concern  . Not on file  Social History Narrative   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[employer]   1 kids daughter 36, son 25 as of 04/05/20   Born in [place] until 34 y.o then lived in [place] now moved [place] with husband    College ed    No guns    Wears seat belt    Safe in relationship    Married     Social Determinants of Radio broadcast assistant Strain:   . Difficulty of Paying Living Expenses:   Food Insecurity:   . Worried About Charity fundraiser in the Last Year:   . Arboriculturist in the Last Year:   Transportation Needs:   . Film/video editor (Medical):   Marland Kitchen Lack of Transportation (Non-Medical):   Physical Activity:   . Days of Exercise per Week:   . Minutes of Exercise per Session:   Stress:   . Feeling of Stress :   Social Connections:   . Frequency of Communication with Friends and Family:   . Frequency of Social Gatherings with Friends and Family:   . Attends Religious Services:   . Active Member of Clubs or Organizations:   . Attends Archivist Meetings:   Marland Kitchen Marital Status:   Intimate Partner Violence:   . Fear of Current or Ex-Partner:   . Emotionally Abused:   Marland Kitchen Physically Abused:   . Sexually Abused:    Current Meds  Medication Sig  . citalopram (CELEXA) 10 MG tablet Take 1 tablet (10 mg total) by mouth daily. In am  . hydrOXYzine (ATARAX/VISTARIL) 25 MG tablet  Take 1-2 tablets (25-50 mg total) by mouth at bedtime as needed.  . Norgestimate-Ethinyl Estradiol Triphasic (TRI-SPRINTEC) 0.18/0.215/0.25 MG-35 MCG tablet Take 1 tablet by mouth daily.  . ondansetron (ZOFRAN) 4 MG tablet Take 1 tablet (4 mg total) by mouth every 8 (eight) hours as needed for nausea or vomiting.  . phentermine (ADIPEX-P) 37.5 MG tablet Take 1 tablet (37.5 mg total) by mouth daily before breakfast.  . triamcinolone cream (KENALOG) 0.1 % Apply 1 application topically 2 (two) times daily. Right hand as needed   No Known Allergies Recent Results (from the past 2160 hour(s))  Comprehensive metabolic panel     Status: None   Collection Time: 03/21/20 11:38 AM  Result Value Ref Range   Sodium 136 135 - 145 mEq/L   Potassium 3.8 3.5 - 5.1 mEq/L   Chloride 103 96 - 112 mEq/L   CO2 26 19 - 32 mEq/L   Glucose, Bld 88 70 - 99 mg/dL   BUN 10 6 - 23 mg/dL    Creatinine, Ser 0.56 0.40 - 1.20 mg/dL   Total Bilirubin 0.3 0.2 - 1.2 mg/dL   Alkaline Phosphatase 59 39 - 117 U/L   AST 14 0 - 37 U/L   ALT 14 0 - 35 U/L   Total Protein 7.4 6.0 - 8.3 g/dL   Albumin 3.8 3.5 - 5.2 g/dL   GFR 124.11 >60.00 mL/min   Calcium 9.0 8.4 - 10.5 mg/dL  CBC with Differential/Platelet     Status: None   Collection Time: 03/21/20 11:38 AM  Result Value Ref Range   WBC 8.6 4.0 - 10.5 K/uL   RBC 4.42 3.87 - 5.11 Mil/uL   Hemoglobin 12.9 12.0 - 15.0 g/dL   HCT 38.2 36 - 46 %   MCV 86.4 78.0 - 100.0 fl   MCHC 33.9 30.0 - 36.0 g/dL   RDW 12.6 11.5 - 15.5 %   Platelets 309.0 150 - 400 K/uL   Neutrophils Relative % 60.5 43 - 77 %   Lymphocytes Relative 31.3 12 - 46 %   Monocytes Relative 5.8 3 - 12 %   Eosinophils Relative 1.8 0 - 5 %   Basophils Relative 0.6 0 - 3 %   Neutro Abs 5.2 1.4 - 7.7 K/uL   Lymphs Abs 2.7 0.7 - 4.0 K/uL   Monocytes Absolute 0.5 0 - 1 K/uL   Eosinophils Absolute 0.2 0 - 0 K/uL   Basophils Absolute 0.0 0 - 0 K/uL  TSH     Status: None   Collection Time: 03/21/20 11:38 AM  Result Value Ref Range   TSH 0.63 0.35 - 4.50 uIU/mL  T4, free     Status: None   Collection Time: 03/21/20 11:38 AM  Result Value Ref Range   Free T4 0.68 0.60 - 1.60 ng/dL    Comment: Specimens from patients who are undergoing biotin therapy and /or ingesting biotin supplements may contain high levels of biotin.  The higher biotin concentration in these specimens interferes with this Free T4 assay.  Specimens that contain high levels  of biotin may cause false high results for this Free T4 assay.  Please interpret results in light of the total clinical presentation of the patient.    Urinalysis, Routine w reflex microscopic     Status: None   Collection Time: 03/21/20 11:38 AM  Result Value Ref Range   Color, Urine YELLOW YELLOW   APPearance CLEAR CLEAR   Specific Gravity, Urine 1.006 1.001 - 1.03  pH 7.0 5.0 - 8.0   Glucose, UA NEGATIVE NEGATIVE    Bilirubin Urine NEGATIVE NEGATIVE   Ketones, ur NEGATIVE NEGATIVE   Hgb urine dipstick NEGATIVE NEGATIVE   Protein, ur NEGATIVE NEGATIVE   Nitrite NEGATIVE NEGATIVE   Leukocytes,Ua NEGATIVE NEGATIVE  Vitamin D (25 hydroxy)     Status: Abnormal   Collection Time: 03/21/20 11:38 AM  Result Value Ref Range   VITD 24.43 (L) 30.00 - 100.00 ng/mL  Vitamin B12     Status: Abnormal   Collection Time: 03/21/20 11:38 AM  Result Value Ref Range   Vitamin B-12 145 (L) 211 - 911 pg/mL  POCT urine pregnancy     Status: Normal   Collection Time: 03/21/20 12:09 PM  Result Value Ref Range   Preg Test, Ur Negative Negative   Objective  Body mass index is 36.01 kg/m. Wt Readings from Last 3 Encounters:  04/05/20 209 lb 12.8 oz (95.2 kg)  03/21/20 210 lb 12.8 oz (95.6 kg)  09/17/19 200 lb (90.7 kg)   Temp Readings from Last 3 Encounters:  04/05/20 98.1 F (36.7 C) (Oral)  03/21/20 97.9 F (36.6 C) (Oral)  09/23/18 98.4 F (36.9 C) (Oral)   BP Readings from Last 3 Encounters:  04/05/20 130/80  03/21/20 124/80  09/23/18 112/64   Pulse Readings from Last 3 Encounters:  04/05/20 71  03/21/20 78  09/23/18 82    Physical Exam Vitals and nursing note reviewed.  Constitutional:      Appearance: Normal appearance. She is well-developed and well-groomed. She is obese.  HENT:     Head: Normocephalic and atraumatic.  Eyes:     Conjunctiva/sclera: Conjunctivae normal.     Pupils: Pupils are equal, round, and reactive to light.  Cardiovascular:     Rate and Rhythm: Normal rate and regular rhythm.     Heart sounds: Normal heart sounds. No murmur heard.   Pulmonary:     Effort: Pulmonary effort is normal.     Breath sounds: Normal breath sounds.  Skin:    General: Skin is warm and dry.  Neurological:     General: No focal deficit present.     Mental Status: She is alert and oriented to person, place, and time. Mental status is at baseline.     Gait: Gait normal.  Psychiatric:         Attention and Perception: Attention and perception normal.        Mood and Affect: Mood and affect normal.        Speech: Speech normal.        Behavior: Behavior normal. Behavior is cooperative.        Thought Content: Thought content normal.        Cognition and Memory: Cognition and memory normal.        Judgment: Judgment normal.     Assessment  Plan  Fatty liver Given info  rec healthy diet and exercise   Abdominal pain, unspecified abdominal location Resolved Ct negative  Pt wants to defer GI referral for now  Given food avoidance list   B12 deficiency Vitamin D deficiency D3 5000 IU qd Cont supplementation disc intranasal B12 in future vs B12 1000 mcg qd   HM Flu shot hadlikely at work hospital Tdap~2017 check [facility] Had 2/2 covid pfizer will sent  Declines STD check, MMR and hep B had with job Fatty liver check hep B titer at work f/u check hep A and C   Reviewed labs rec  healthy diet choices and exercise  [facility] had nexplanon mammo age 54  Colonoscopy age 22   rec meditation, aromatherapy for anxiety which is now improved  rec D3 5000 IU with MVT vitafusion qd Provider: Dr. Olivia Mackie McLean-Scocuzza-Internal Medicine

## 2020-04-05 NOTE — Patient Instructions (Addendum)
Check hepatitis B titer and make sure level is greater than than 10   B12 1000 mcg daily or nasal B12 this is prescription   Tums, prilosec, or pepcid for reflux   Nonalcoholic Fatty Liver Disease Diet, Adult Nonalcoholic fatty liver disease is a condition that causes fat to build up in and around the liver. The disease makes it harder for the liver to work the way that it should. Following a healthy diet can help to keep nonalcoholic fatty liver disease under control. It can also help to prevent or improve conditions that are associated with the disease, such as heart disease, diabetes, high blood pressure, and abnormal cholesterol levels. Along with regular exercise, this diet:  Promotes weight loss.  Helps to control blood sugar levels.  Helps to improve the way that the body uses insulin. What are tips for following this plan? Reading food labels Always check food labels for:  The amount of saturated fat in a food. You should limit your intake of saturated fat. Saturated fat is found in foods that come from animals, including meat and dairy products such as butter, cheese, and whole milk.  The amount of fiber in a food. You should choose high-fiber foods such as fruits, vegetables, and whole grains. Try to get 25-30 grams (g) of fiber a day.  Cooking  When cooking, use heart-healthy oils that are high in monounsaturated fats. These include olive oil, canola oil, and avocado oil.  Limit frying or deep-frying foods. Cook foods using healthy methods such as baking, boiling, steaming, and grilling instead. Meal planning  You may want to keep track of how many calories you take in. Eating the right amount of calories will help you achieve a healthy weight. Meeting with a registered dietitian can help you get started.  Limit how often you eat takeout and fast food. These foods are usually very high in fat, salt, and sugar.  Use the glycemic index (GI) to plan your meals. The index  tells you how quickly a food will raise your blood sugar. Choose low-GI foods (GI less than 55). These foods take a longer time to raise blood sugar. A registered dietitian can help you identify foods lower on the GI scale. Lifestyle  You may want to follow a Mediterranean diet. This diet includes a lot of vegetables, lean meats or fish, whole grains, fruits, and healthy oils and fats. What foods can I eat?  Fruits Bananas. Apples. Oranges. Grapes. Papaya. Mango. Pomegranate. Kiwi. Grapefruit. Cherries. Vegetables Lettuce. Spinach. Peas. Beets. Cauliflower. Cabbage. Broccoli. Carrots. Tomatoes. Squash. Eggplant. Herbs. Peppers. Onions. Cucumbers. Brussels sprouts. Yams and sweet potatoes. Beans. Lentils. Grains Whole wheat or whole-grain foods, including breads, crackers, cereals, and pasta. Stone-ground whole wheat. Unsweetened oatmeal. Bulgur. Barley. Quinoa. Brown or wild rice. Corn or whole wheat flour tortillas. Meats and other proteins Lean meats. Poultry. Tofu. Seafood and shellfish. Dairy Low-fat or fat-free dairy products, such as yogurt, cottage cheese, or cheese. Beverages Water. Sugar-free drinks. Tea. Coffee. Low-fat or skim milk. Milk alternatives, such as soy or almond milk. Real fruit juice. Fats and oils Avocado. Canola or olive oil. Nuts and nut butters. Seeds. Seasonings and condiments Mustard. Relish. Low-fat, low-sugar ketchup and barbecue sauce. Low-fat or fat-free mayonnaise. Sweets and desserts Sugar-free sweets. The items listed above may not be a complete list of foods and beverages you can eat. Contact a dietitian for more information. What foods should I limit or avoid? Meats and other proteins Limit red meat to 1-2  times a week. Dairy [employer]. Fats and oils Palm oil and coconut oil. Fried foods. Other foods Processed foods. Foods that contain a lot of salt or sodium. Sweets and desserts Sweets that contain sugar. Beverages Sweetened drinks,  such as sweet tea, milkshakes, iced sweet drinks, and sodas. Alcohol. The items listed above may not be a complete list of foods and beverages you should avoid. Contact a dietitian for more information. Where to find more information The [employer] of Diabetes and Digestive and Kidney Diseases: [email or web address] Summary  Nonalcoholic fatty liver disease is a condition that causes fat to build up in and around the liver.  Following a healthy diet can help to keep nonalcoholic fatty liver disease under control. Your diet should be rich in fruits, vegetables, whole grains, and lean proteins.  Limit your intake of saturated fat. Saturated fat is found in foods that come from animals, including meat and dairy products such as butter, cheese, and whole milk.  This diet promotes weight loss, helps to control blood sugar levels, and helps to improve the way that the body uses insulin. This information is not intended to replace advice given to you by your health care provider. Make sure you discuss any questions you have with your health care provider. Document Revised: 12/11/2018 Document Reviewed: 09/10/2018 Elsevier Patient Education  2020 Elsevier Inc.  Fatty Liver Disease  Fatty liver disease occurs when too much fat has built up in your liver cells. Fatty liver disease is also called hepatic steatosis or steatohepatitis. The liver removes harmful substances from your bloodstream and produces fluids that your body needs. It also helps your body use and store energy from the food you eat. In many cases, fatty liver disease does not cause symptoms or problems. It is often diagnosed when tests are being done for other reasons. However, over time, fatty liver can cause inflammation that may lead to more serious liver problems, such as scarring of the liver (cirrhosis) and liver failure. Fatty liver is associated with insulin resistance, increased body fat, high blood pressure (hypertension), and  high cholesterol. These are features of metabolic syndrome and increase your risk for stroke, diabetes, and heart disease. What are the causes? This condition may be caused by:  Drinking too much alcohol.  Poor nutrition.  Obesity.  Cushing's syndrome.  Diabetes.  High cholesterol.  Certain drugs.  Poisons.  Some viral infections.  Pregnancy. What increases the risk? You are more likely to develop this condition if you:  Abuse alcohol.  Are overweight.  Have diabetes.  Have hepatitis.  Have a high triglyceride level.  Are pregnant. What are the signs or symptoms? Fatty liver disease often does not cause symptoms. If symptoms do develop, they can include:  Fatigue.  Weakness.  Weight loss.  Confusion.  Abdominal pain.  Nausea and vomiting.  Yellowing of your skin and the white parts of your eyes (jaundice).  Itchy skin. How is this diagnosed? This condition may be diagnosed by:  A physical exam and medical history.  Blood tests.  Imaging tests, such as an ultrasound, CT scan, or MRI.  A liver biopsy. A small sample of liver tissue is removed using a needle. The sample is then looked at under a microscope. How is this treated? Fatty liver disease is often caused by other health conditions. Treatment for fatty liver may involve medicines and lifestyle changes to manage conditions such as:  Alcoholism.  High cholesterol.  Diabetes.  Being overweight or obese. Follow these  instructions at home:   Do not drink alcohol. If you have trouble quitting, ask your health care provider how to safely quit with the help of medicine or a supervised program. This is important to keep your condition from getting worse.  Eat a healthy diet as told by your health care provider. Ask your health care provider about working with a diet and nutrition specialist (dietitian) to develop an eating plan.  Exercise regularly. This can help you lose weight and  control your cholesterol and diabetes. Talk to your health care provider about an exercise plan and which activities are best for you.  Take over-the-counter and prescription medicines only as told by your health care provider.  Keep all follow-up visits as told by your health care provider. This is important. Contact a health care provider if: You have trouble controlling your:  Blood sugar. This is especially important if you have diabetes.  Cholesterol.  Drinking of alcohol. Get help right away if:  You have abdominal pain.  You have jaundice.  You have nausea and vomiting.  You vomit blood or material that looks like coffee grounds.  You have stools that are black, tar-like, or bloody. Summary  Fatty liver disease develops when too much fat builds up in the cells of your liver.  Fatty liver disease often causes no symptoms or problems. However, over time, fatty liver can cause inflammation that may lead to more serious liver problems, such as scarring of the liver (cirrhosis).  You are more likely to develop this condition if you abuse alcohol, are pregnant, are overweight, have diabetes, have hepatitis, or have high triglyceride levels.  Contact your health care provider if you have trouble controlling your weight, blood sugar, cholesterol, or drinking of alcohol. This information is not intended to replace advice given to you by your health care provider. Make sure you discuss any questions you have with your health care provider. Document Revised: 08/01/2017 Document Reviewed: 05/28/2017 Elsevier Patient Education  2020 [employer].  Food Choices for Gastroesophageal Reflux Disease, Adult When you have gastroesophageal reflux disease (GERD), the foods you eat and your eating habits are very important. Choosing the right foods can help ease the discomfort of GERD. Consider working with a diet and nutrition specialist (dietitian) to help you make healthy food  choices. What general guidelines should I follow?  Eating plan  Choose healthy foods low in fat, such as fruits, vegetables, whole grains, low-fat dairy products, and lean meat, fish, and poultry.  Eat frequent, small meals instead of three large meals each day. Eat your meals slowly, in a relaxed setting. Avoid bending over or lying down until 2-3 hours after eating.  Limit high-fat foods such as fatty meats or fried foods.  Limit your intake of oils, butter, and shortening to less than 8 teaspoons each day.  Avoid the following: ? Foods that cause symptoms. These may be different for different people. Keep a food diary to keep track of foods that cause symptoms. ? Alcohol. ? Drinking large amounts of liquid with meals. ? Eating meals during the 2-3 hours before bed.  Cook foods using methods other than frying. This may include baking, grilling, or broiling. Lifestyle  Maintain a healthy weight. Ask your health care provider what weight is healthy for you. If you need to lose weight, work with your health care provider to do so safely.  Exercise for at least 30 minutes on 5 or more days each week, or as told by your  health care provider.  Avoid wearing clothes that fit tightly around your waist and chest.  Do not use any products that contain nicotine or tobacco, such as cigarettes and e-cigarettes. If you need help quitting, ask your health care provider.  Sleep with the head of your bed raised. Use a wedge under the mattress or blocks under the bed frame to raise the head of the bed. What foods are not recommended? The items listed may not be a complete list. Talk with your dietitian about what dietary choices are best for you. Grains Pastries or quick breads with added fat. [place] toast. Vegetables Deep fried vegetables. [place] fries. Any vegetables prepared with added fat. Any vegetables that cause symptoms. For some people this may include tomatoes and tomato products, chili  peppers, onions and garlic, and horseradish. Fruits Any fruits prepared with added fat. Any fruits that cause symptoms. For some people this may include citrus fruits, such as oranges, grapefruit, pineapple, and lemons. Meats and other protein foods High-fat meats, such as fatty beef or pork, hot dogs, ribs, ham, sausage, salami and bacon. Fried meat or protein, including fried fish and fried chicken. Nuts and nut butters. Dairy Whole milk and chocolate milk. Sour cream. Cream. Ice cream. Cream cheese. Milk shakes. Beverages Coffee and tea, with or without caffeine. Carbonated beverages. Sodas. Energy drinks. Fruit juice made with acidic fruits (such as orange or grapefruit). Tomato juice. Alcoholic drinks. Fats and oils Butter. Margarine. Shortening. Ghee. Sweets and desserts Chocolate and cocoa. Donuts. Seasoning and other foods Pepper. Peppermint and spearmint. Any condiments, herbs, or seasonings that cause symptoms. For some people, this may include curry, hot sauce, or vinegar-based salad dressings. Summary  When you have gastroesophageal reflux disease (GERD), food and lifestyle choices are very important to help ease the discomfort of GERD.  Eat frequent, small meals instead of three large meals each day. Eat your meals slowly, in a relaxed setting. Avoid bending over or lying down until 2-3 hours after eating.  Limit high-fat foods such as fatty meat or fried foods. This information is not intended to replace advice given to you by your health care provider. Make sure you discuss any questions you have with your health care provider. Document Revised: 12/10/2018 Document Reviewed: 08/20/2016 Elsevier Patient Education  2020 Elsevier Inc.  Vitamin B12 Deficiency Vitamin B12 deficiency occurs when the body does not have enough vitamin B12, which is an important vitamin. The body needs this vitamin:  To make red blood cells.  To make DNA. This is the genetic material inside  cells.  To help the nerves work properly so they can carry messages from the brain to the body. Vitamin B12 deficiency can cause various health problems, such as a low red blood cell count (anemia) or nerve damage. What are the causes? This condition may be caused by:  Not eating enough foods that contain vitamin B12.  Not having enough stomach acid and digestive fluids to properly absorb vitamin B12 from the food that you eat.  Certain digestive system diseases that make it hard to absorb vitamin B12. These diseases include Crohn's disease, chronic pancreatitis, and cystic fibrosis.  A condition in which the body does not make enough of a protein (intrinsic factor), resulting in too few red blood cells (pernicious anemia).  Having a surgery in which part of the stomach or small intestine is removed.  Taking certain medicines that make it hard for the body to absorb vitamin B12. These medicines include: ? Heartburn  medicines (antacids and proton pump inhibitors). ? Certain antibiotic medicines. ? Some medicines that are used to treat diabetes, tuberculosis, gout, or high cholesterol. What increases the risk? The following factors may make you more likely to develop a B12 deficiency:  Being older than age 6.  Eating a vegetarian or vegan diet, especially while you are pregnant.  Eating a poor diet while you are pregnant.  Taking certain medicines.  Having alcoholism. What are the signs or symptoms? In some cases, there are no symptoms of this condition. If the condition leads to anemia or nerve damage, various symptoms can occur, such as:  Weakness.  Fatigue.  Loss of appetite.  Weight loss.  Numbness or tingling in your hands and feet.  Redness and burning of the tongue.  Confusion or memory problems.  Depression.  Sensory problems, such as color blindness, ringing in the ears, or loss of taste.  Diarrhea or constipation.  Trouble walking. If anemia is  severe, symptoms can include:  Shortness of breath.  Dizziness.  Rapid heart rate (tachycardia). How is this diagnosed? This condition may be diagnosed with a blood test to measure the level of vitamin B12 in your blood. You may also have other tests, including:  A group of tests that measure certain characteristics of blood cells (complete blood count, CBC).  A blood test to measure intrinsic factor.  A procedure where a thin tube with a camera on the end is used to look into your stomach or intestines (endoscopy). Other tests may be needed to discover the cause of B12 deficiency. How is this treated? Treatment for this condition depends on the cause. This condition may be treated by:  Changing your eating and drinking habits, such as: ? Eating more foods that contain vitamin B12. ? Drinking less alcohol or no alcohol.  Getting vitamin B12 injections.  Taking vitamin B12 supplements. Your health care provider will tell you which dosage is best for you. Follow these instructions at home: Eating and drinking   Eat lots of healthy foods that contain vitamin B12, including: ? Meats and poultry. This includes beef, pork, chicken, [place], and organ meats, such as liver. ? Seafood. This includes clams, rainbow trout, salmon, tuna, and haddock. ? Eggs. ? Cereal and dairy products that are fortified. This means that vitamin B12 has been added to the food. Check the label on the package to see if the food is fortified. The items listed above may not be a complete list of recommended foods and beverages. Contact a dietitian for more information. General instructions  Get any injections that are prescribed by your health care provider.  Take supplements only as told by your health care provider. Follow the directions carefully.  Do not drink alcohol if your health care provider tells you not to. In some cases, you may only be asked to limit alcohol use.  Keep all follow-up visits as  told by your health care provider. This is important. Contact a health care provider if:  Your symptoms come back. Get help right away if you:  Develop shortness of breath.  Have a rapid heart rate.  Have chest pain.  Become dizzy or lose consciousness. Summary  Vitamin B12 deficiency occurs when the body does not have enough vitamin B12.  The main causes of vitamin B12 deficiency include dietary deficiency, digestive diseases, pernicious anemia, and having a surgery in which part of the stomach or small intestine is removed.  In some cases, there are no symptoms of  this condition. If the condition leads to anemia or nerve damage, various symptoms can occur, such as weakness, shortness of breath, and numbness.  Treatment may include getting vitamin B12 injections or taking vitamin B12 supplements. Eat lots of healthy foods that contain vitamin B12. This information is not intended to replace advice given to you by your health care provider. Make sure you discuss any questions you have with your health care provider. Document Revised: 02/05/2019 Document Reviewed: 04/28/2018 Elsevier Patient Education  2020 [employer]

## 2020-04-05 NOTE — Progress Notes (Signed)
Patient presenting for follow up for abdominal pain. Gi has reached out to the Patient from referral.  Patient is debating on still doing this as she states she feels the GI doctor's solution will be to just loose weight.

## 2020-04-07 ENCOUNTER — Telehealth: Payer: Self-pay | Admitting: Internal Medicine

## 2020-04-07 NOTE — Telephone Encounter (Signed)
Pt had questions about the B12 Injection

## 2020-04-10 NOTE — Telephone Encounter (Signed)
Pt wanted to know if her B12 injections are weekly or monthly.

## 2020-04-11 ENCOUNTER — Ambulatory Visit (INDEPENDENT_AMBULATORY_CARE_PROVIDER_SITE_OTHER): Payer: No Typology Code available for payment source

## 2020-04-11 ENCOUNTER — Other Ambulatory Visit: Payer: Self-pay

## 2020-04-11 DIAGNOSIS — E538 Deficiency of other specified B group vitamins: Secondary | ICD-10-CM

## 2020-04-11 MED ORDER — CYANOCOBALAMIN 1000 MCG/ML IJ SOLN
1000.0000 ug | Freq: Once | INTRAMUSCULAR | Status: AC
  Administered 2020-04-11: 1000 ug via INTRAMUSCULAR

## 2020-04-11 NOTE — Progress Notes (Signed)
Patient presented for B 12 injection to left deltoid, patient voiced no concerns nor showed any signs of distress during injection. 

## 2020-04-11 NOTE — Telephone Encounter (Signed)
Lm for patient to call office to set up next week, b12 (2 of 4) weekly injections.

## 2020-04-11 NOTE — Telephone Encounter (Signed)
B12 injections are 1x per week x 1 month then every month  Please schedule accordingly

## 2020-04-11 NOTE — Telephone Encounter (Signed)
Patient states she has spoken with Wanda Davis again and was informed. Verified scheduled for next injection 04/18/20.

## 2020-04-18 ENCOUNTER — Ambulatory Visit (INDEPENDENT_AMBULATORY_CARE_PROVIDER_SITE_OTHER): Payer: No Typology Code available for payment source

## 2020-04-18 ENCOUNTER — Other Ambulatory Visit: Payer: Self-pay

## 2020-04-18 DIAGNOSIS — E538 Deficiency of other specified B group vitamins: Secondary | ICD-10-CM

## 2020-04-18 MED ORDER — CYANOCOBALAMIN 1000 MCG/ML IJ SOLN
1000.0000 ug | Freq: Once | INTRAMUSCULAR | Status: AC
  Administered 2020-04-18: 1000 ug via INTRAMUSCULAR

## 2020-04-18 NOTE — Progress Notes (Signed)
Patient presented for B 12 injection to left deltoid, patient voiced no concerns nor showed any signs of distress during injection. 

## 2020-04-26 ENCOUNTER — Other Ambulatory Visit: Payer: Self-pay

## 2020-04-26 ENCOUNTER — Ambulatory Visit (INDEPENDENT_AMBULATORY_CARE_PROVIDER_SITE_OTHER): Payer: No Typology Code available for payment source

## 2020-04-26 DIAGNOSIS — E538 Deficiency of other specified B group vitamins: Secondary | ICD-10-CM | POA: Diagnosis not present

## 2020-04-26 MED ORDER — CYANOCOBALAMIN 1000 MCG/ML IJ SOLN
1000.0000 ug | Freq: Once | INTRAMUSCULAR | Status: AC
  Administered 2020-04-26: 1000 ug via INTRAMUSCULAR

## 2020-04-26 NOTE — Progress Notes (Signed)
Patient presented for B 12 injection to left deltoid, patient voiced no concerns nor showed any signs of distress during injection. 

## 2020-05-09 ENCOUNTER — Ambulatory Visit: Payer: No Typology Code available for payment source

## 2020-05-25 ENCOUNTER — Other Ambulatory Visit: Payer: Self-pay

## 2020-05-25 ENCOUNTER — Ambulatory Visit (INDEPENDENT_AMBULATORY_CARE_PROVIDER_SITE_OTHER): Payer: No Typology Code available for payment source

## 2020-05-25 DIAGNOSIS — E538 Deficiency of other specified B group vitamins: Secondary | ICD-10-CM

## 2020-05-25 MED ORDER — CYANOCOBALAMIN 1000 MCG/ML IJ SOLN
1000.0000 ug | Freq: Once | INTRAMUSCULAR | Status: AC
  Administered 2020-05-25: 1000 ug via INTRAMUSCULAR

## 2020-05-25 NOTE — Progress Notes (Signed)
Patient presented for B 12 injection to left deltoid, patient voiced no concerns nor showed any signs of distress during injection. 

## 2020-05-30 ENCOUNTER — Ambulatory Visit: Payer: No Typology Code available for payment source

## 2020-06-27 ENCOUNTER — Other Ambulatory Visit: Payer: Self-pay

## 2020-06-27 ENCOUNTER — Ambulatory Visit (INDEPENDENT_AMBULATORY_CARE_PROVIDER_SITE_OTHER): Payer: No Typology Code available for payment source

## 2020-06-27 DIAGNOSIS — E538 Deficiency of other specified B group vitamins: Secondary | ICD-10-CM

## 2020-06-27 MED ORDER — CYANOCOBALAMIN 1000 MCG/ML IJ SOLN
1000.0000 ug | Freq: Once | INTRAMUSCULAR | Status: AC
  Administered 2020-06-27: 1000 ug via INTRAMUSCULAR

## 2020-06-27 NOTE — Progress Notes (Addendum)
Patient presented for B 12 injection to left deltoid, patient voiced no concerns nor showed any signs of distress during injection.  Reviewed.  Dr Scott 

## 2020-07-14 ENCOUNTER — Other Ambulatory Visit: Payer: Self-pay | Admitting: Internal Medicine

## 2020-07-14 ENCOUNTER — Encounter: Payer: Self-pay | Admitting: Internal Medicine

## 2020-07-14 DIAGNOSIS — F419 Anxiety disorder, unspecified: Secondary | ICD-10-CM

## 2020-07-14 MED ORDER — CITALOPRAM HYDROBROMIDE 20 MG PO TABS: 20.0000 mg | ORAL_TABLET | Freq: Every day | ORAL | 3 refills | Status: DC

## 2020-08-01 ENCOUNTER — Ambulatory Visit (INDEPENDENT_AMBULATORY_CARE_PROVIDER_SITE_OTHER): Payer: No Typology Code available for payment source

## 2020-08-01 ENCOUNTER — Other Ambulatory Visit: Payer: Self-pay

## 2020-08-01 DIAGNOSIS — E538 Deficiency of other specified B group vitamins: Secondary | ICD-10-CM | POA: Diagnosis not present

## 2020-08-01 MED ORDER — CYANOCOBALAMIN 1000 MCG/ML IJ SOLN
1000.0000 ug | Freq: Once | INTRAMUSCULAR | Status: AC
  Administered 2020-08-01: 1000 ug via INTRAMUSCULAR

## 2020-08-01 NOTE — Progress Notes (Signed)
Patient presented for B 12 injection to left deltoid, patient voiced no concerns nor showed any signs of distress during injection. 

## 2020-09-05 ENCOUNTER — Encounter: Payer: Self-pay | Admitting: Internal Medicine

## 2020-09-05 ENCOUNTER — Other Ambulatory Visit: Payer: Self-pay | Admitting: Internal Medicine

## 2020-09-05 MED ORDER — NORGESTIM-ETH ESTRAD TRIPHASIC 0.18/0.215/0.25 MG-35 MCG PO TABS
1.0000 | ORAL_TABLET | Freq: Every day | ORAL | 3 refills | Status: DC
Start: 2020-09-05 — End: 2020-09-05

## 2020-09-06 ENCOUNTER — Other Ambulatory Visit: Payer: Self-pay | Admitting: Internal Medicine

## 2020-09-06 DIAGNOSIS — E538 Deficiency of other specified B group vitamins: Secondary | ICD-10-CM

## 2020-09-06 MED ORDER — CYANOCOBALAMIN 500 MCG/0.1ML NA SOLN: NASAL | 4 refills | Status: DC

## 2020-09-11 ENCOUNTER — Telehealth: Payer: Self-pay | Admitting: Internal Medicine

## 2020-09-11 NOTE — Telephone Encounter (Signed)
Prior authorization has been submitted for patient's B12 solution.   Awaiting approval or denial.

## 2020-09-14 ENCOUNTER — Telehealth: Payer: Self-pay | Admitting: Internal Medicine

## 2020-09-14 NOTE — Telephone Encounter (Signed)
PA Uploaded and denied:   A previously denied or partially denied PA request has been located for this member. To initiate an appeal or reconsideration please call [phone]. Thank you.  Financial support may be available through the manufacturer's patient savings program. For more information, and to see program requirements, follow the link below:  Patient Savings Program: click here.  NASCOBAL is a vitamin B12 indicated for: Vitamin B12 maintenance therapy in adult patients with pernicious anemia who are in remission following intramuscular vitamin B12 therapy and who have no nervous system involvement  Treatment of adult patients with dietary, drug-induced, or malabsorption-related vitamin B12 deficiency not due to pernicious anemia  Prevention of vitamin B12 deficiency in adult patients with vitamin B12 requirements in excess of normal  Limitations of Use: Should not be used for the vitamin B12 absorption test (Schilling test). In patients with correctible or temporary causes of vitamin B12 deficiency as the benefit of continued long-term use following correction of vitamin B12 deficiency and underlying disease has not been established.  In patients with active symptoms of nasal congestion, allergic rhinitis or upper respiratory infection effectiveness has not been established.  Commonly used ICD-10 diagnosis code(s): D51.* Vitamin B12 deficiency anemia E53.8 Deficiency of other specified B group vitamins E53.9 Vitamin B deficiency, unspecified

## 2020-09-14 NOTE — Telephone Encounter (Signed)
She can take otc B12 1000 mcg daily would not cover nasal inform

## 2020-09-14 NOTE — Telephone Encounter (Signed)
Patience called in stated that insurance does not cover the B12 nasal spray and also the B12 inj is not covered the insurance stated that it was not coded as medical condition and they charge her 1000 dollars please help

## 2020-09-15 NOTE — Telephone Encounter (Signed)
Pt called returned your call would like a call back.

## 2020-09-15 NOTE — Telephone Encounter (Signed)
Patient informed and verbalized understanding

## 2020-09-15 NOTE — Telephone Encounter (Signed)
Left message to return call 

## 2020-09-28 NOTE — Telephone Encounter (Signed)
Prior authorization has been denied.  Patient was informed

## 2020-12-02 ENCOUNTER — Other Ambulatory Visit: Payer: Self-pay

## 2020-12-02 MED FILL — Citalopram Hydrobromide Tab 20 MG (Base Equiv): ORAL | 90 days supply | Qty: 90 | Fill #0 | Status: AC

## 2020-12-02 MED FILL — Norgestimate-Eth Estrad Tab 0.18-35/0.215-35/0.25-35 MG-MCG: ORAL | 84 days supply | Qty: 84 | Fill #0 | Status: AC

## 2021-02-11 IMAGING — CT CT ABD-PELV W/ CM
1 of 2 series · 15 of 32 positions shown, 19 images · IV contrast (APPLIED)
Comparison: None.

CLINICAL DATA: Left mid abdominal pain for 5-6 months.

EXAM:
CT ABDOMEN AND PELVIS WITH CONTRAST
TECHNIQUE: Multidetector CT imaging of the abdomen and pelvis was performed
using the standard protocol following bolus administration of
intravenous contrast.
CONTRAST:  100 mL OMNIPAQUE IOHEXOL 300 MG/ML  SOLN

[Series 2: axial st · axial · 0.94mm/px · z∈[-908,-443]mm · 15 of 103 slices shown, 19 images]
[im 5/103  soft-tissue]
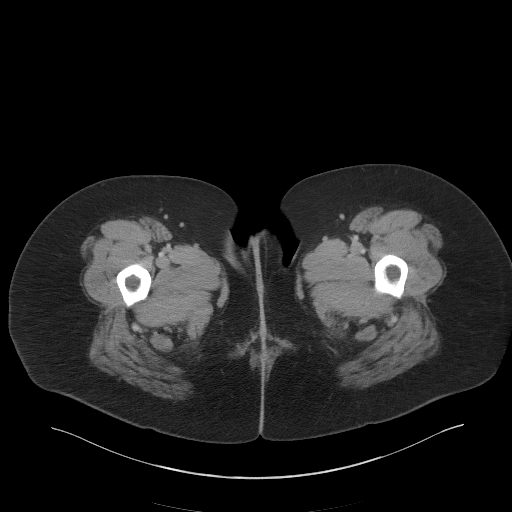
[im 5/103  bone]
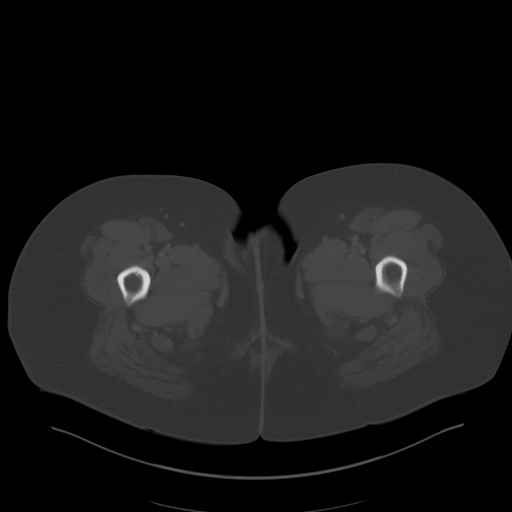
[im 14/103  soft-tissue]
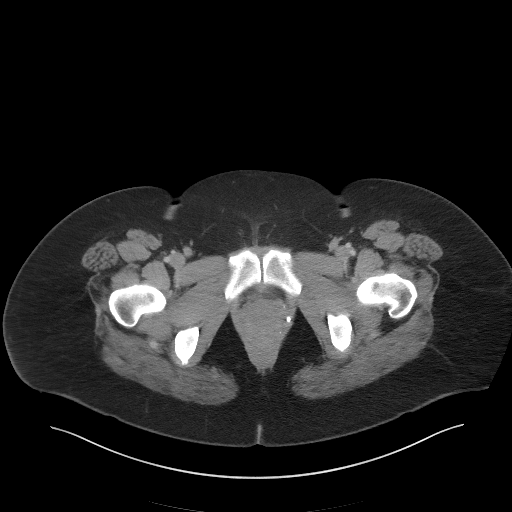
[im 23/103  soft-tissue]
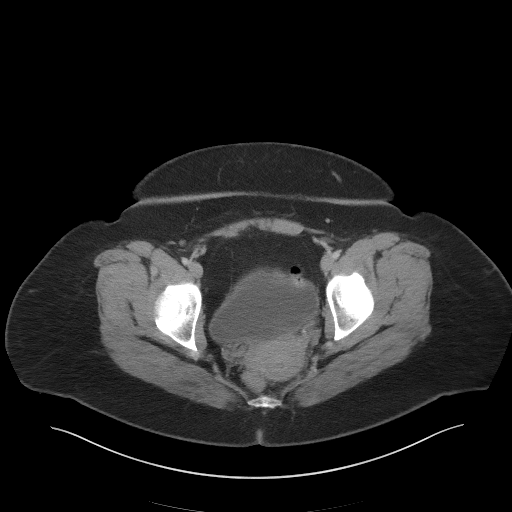
[im 27/103  soft-tissue]
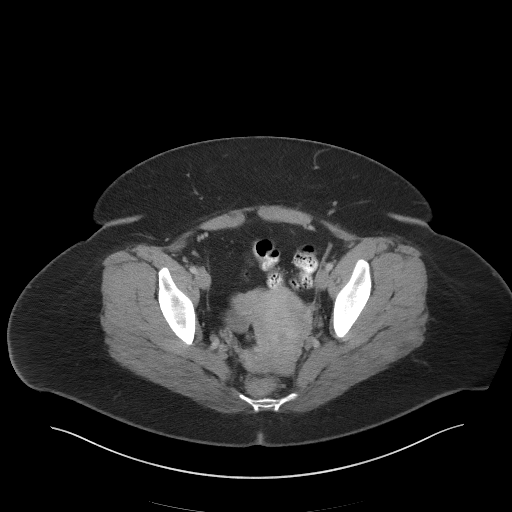
[im 36/103  soft-tissue]
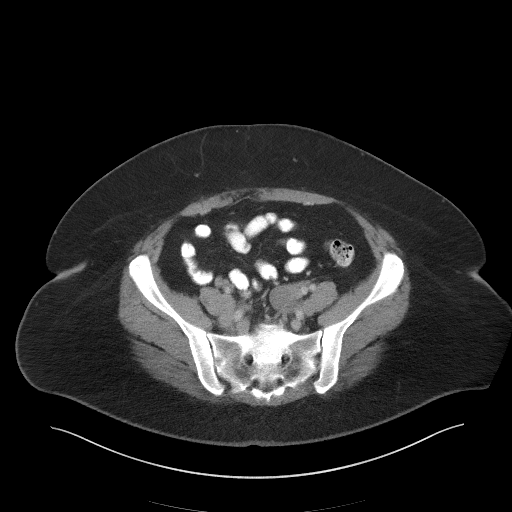
[im 45/103  soft-tissue]
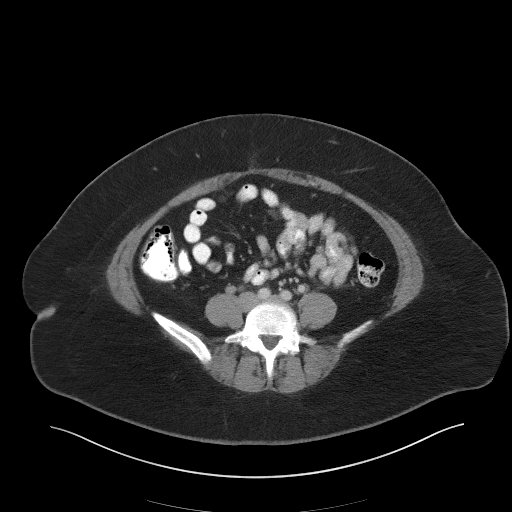
[im 54/103  soft-tissue]
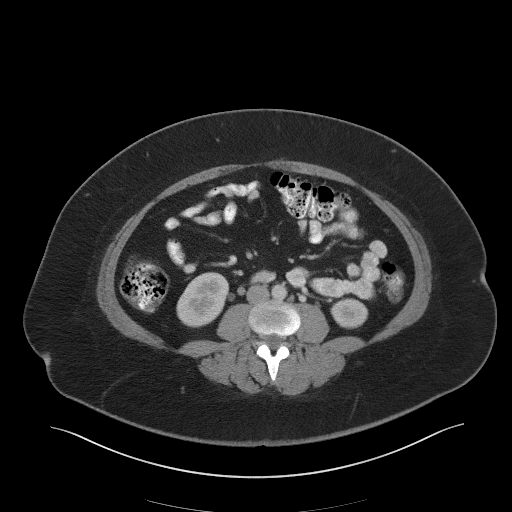
[im 58/103  soft-tissue]
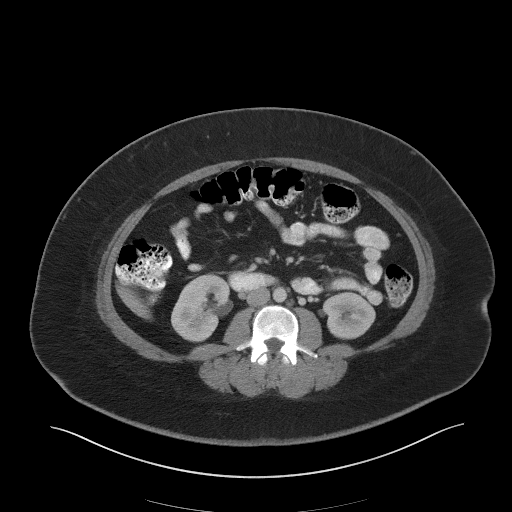
[im 67/103  soft-tissue]
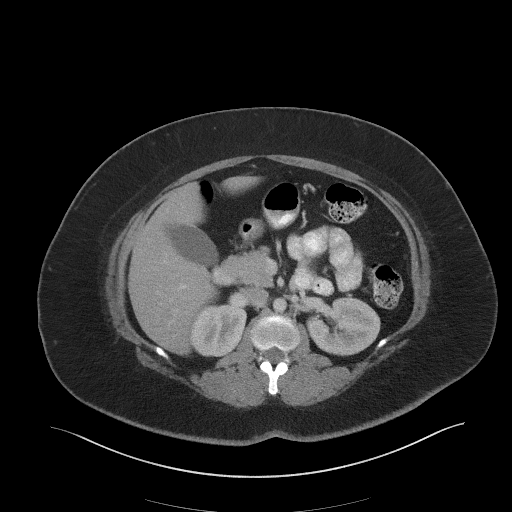
[im 67/103  bone]
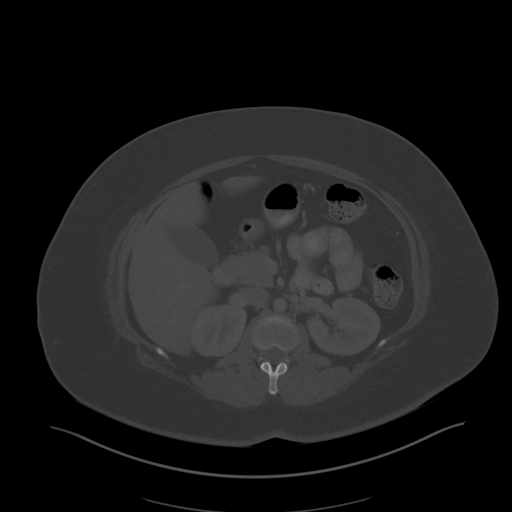
[im 76/103  soft-tissue]
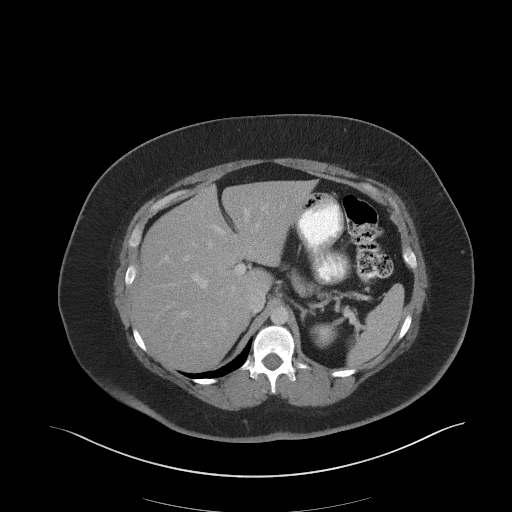
[im 80/103  soft-tissue]
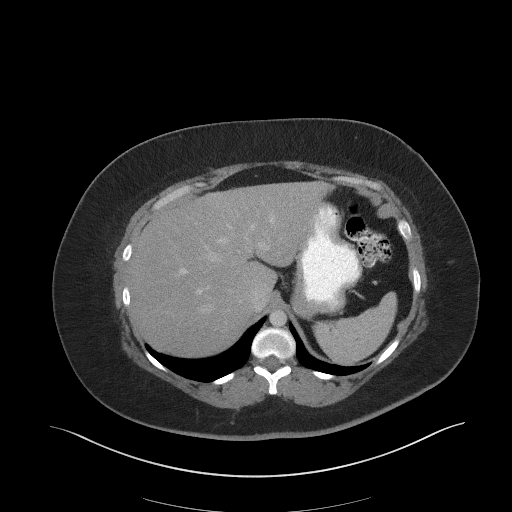
[im 85/103  lung]
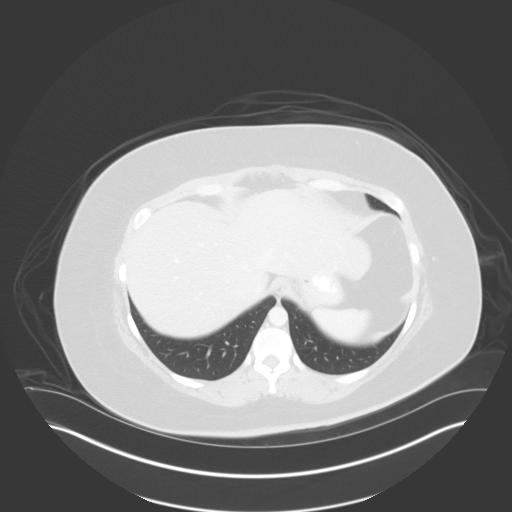
[im 89/103  soft-tissue]
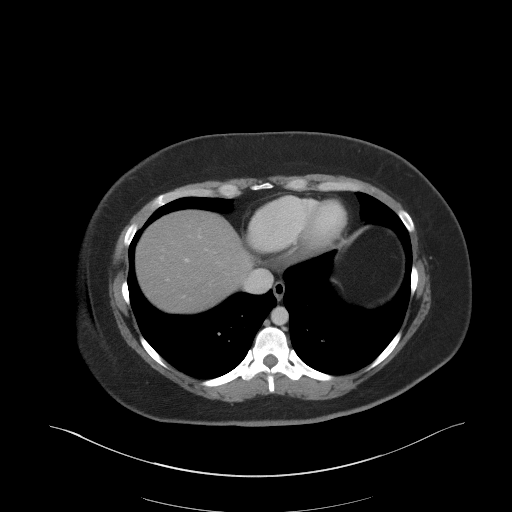
[im 89/103  lung]
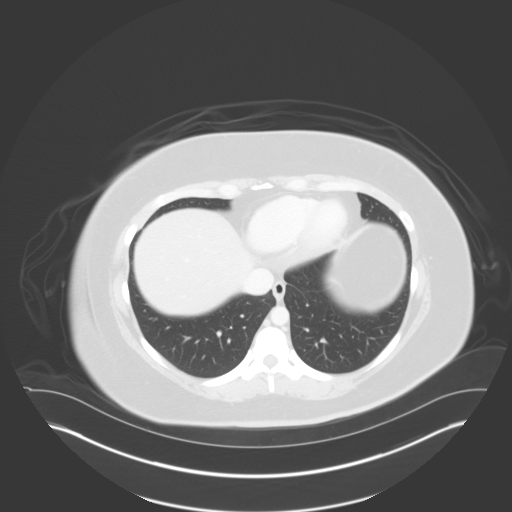
[im 94/103  lung]
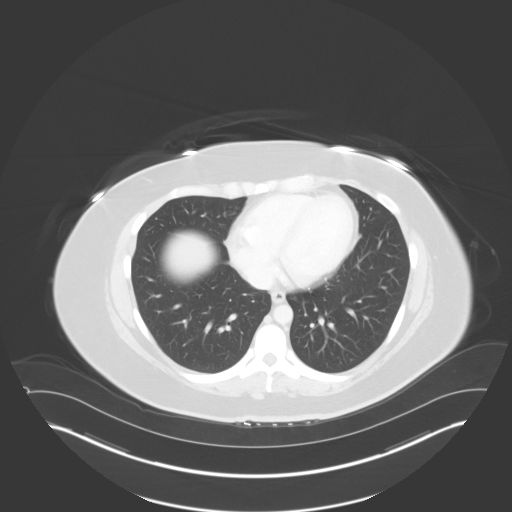
[im 98/103  soft-tissue]
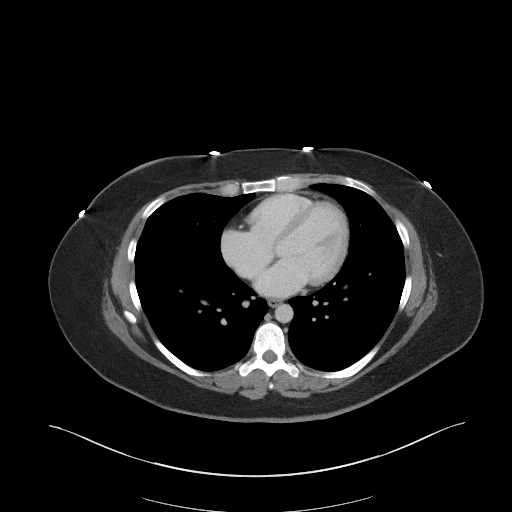
[im 98/103  lung]
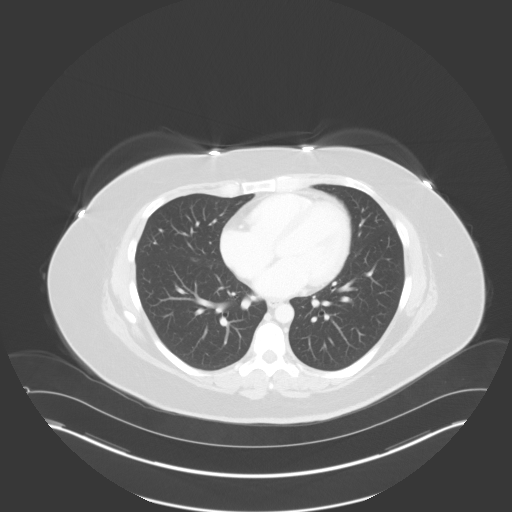

[15 of 32 positions shown; findings below may reference images not displayed]

FINDINGS: Lower chest: Lung bases are clear. No pleural or pericardial
effusion.

Hepatobiliary: No focal liver abnormality is seen. No gallstones,
gallbladder wall thickening, or biliary dilatation. Diffuse
low-attenuation of the liver consistent with fatty infiltration is
noted.

Pancreas: Unremarkable. No pancreatic ductal dilatation or
surrounding inflammatory changes.

Spleen: Normal in size without focal abnormality.

Adrenals/Urinary Tract: Adrenal glands are unremarkable. Kidneys are
normal, without renal calculi, focal lesion, or hydronephrosis.
Bladder is unremarkable.

Stomach/Bowel: Stomach is within normal limits. Appendix appears
normal. No evidence of bowel wall thickening, distention, or
inflammatory changes.

Vascular/Lymphatic: No significant vascular findings are present. No
enlarged abdominal or pelvic lymph nodes.

Reproductive: Uterus and bilateral adnexa are unremarkable.

Other: Markers are placed over the region of concern. There is no
underlying fluid collection, mass, hernia or other abnormality.

Musculoskeletal: Negative.
IMPRESSION: No abnormality is present in the region of concern. No acute
abnormality or finding to explain the patient's symptoms.

Fatty infiltration of the liver.

## 2021-03-02 ENCOUNTER — Other Ambulatory Visit: Payer: Self-pay

## 2021-03-02 MED FILL — Norgestimate-Eth Estrad Tab 0.18-35/0.215-35/0.25-35 MG-MCG: ORAL | 84 days supply | Qty: 84 | Fill #1 | Status: AC

## 2021-03-02 MED FILL — Citalopram Hydrobromide Tab 20 MG (Base Equiv): ORAL | 90 days supply | Qty: 90 | Fill #1 | Status: AC

## 2021-04-05 ENCOUNTER — Other Ambulatory Visit: Payer: Self-pay

## 2021-04-05 ENCOUNTER — Encounter: Payer: Self-pay | Admitting: Internal Medicine

## 2021-04-05 ENCOUNTER — Ambulatory Visit (INDEPENDENT_AMBULATORY_CARE_PROVIDER_SITE_OTHER): Payer: No Typology Code available for payment source | Admitting: Internal Medicine

## 2021-04-05 VITALS — BP 120/80 | HR 74 | Temp 96.3°F | Ht 64.09 in | Wt 223.6 lb

## 2021-04-05 DIAGNOSIS — E559 Vitamin D deficiency, unspecified: Secondary | ICD-10-CM | POA: Diagnosis not present

## 2021-04-05 DIAGNOSIS — F419 Anxiety disorder, unspecified: Secondary | ICD-10-CM | POA: Diagnosis not present

## 2021-04-05 DIAGNOSIS — E538 Deficiency of other specified B group vitamins: Secondary | ICD-10-CM | POA: Diagnosis not present

## 2021-04-05 DIAGNOSIS — J45990 Exercise induced bronchospasm: Secondary | ICD-10-CM

## 2021-04-05 DIAGNOSIS — Z0000 Encounter for general adult medical examination without abnormal findings: Secondary | ICD-10-CM

## 2021-04-05 DIAGNOSIS — R1032 Left lower quadrant pain: Secondary | ICD-10-CM

## 2021-04-05 DIAGNOSIS — R739 Hyperglycemia, unspecified: Secondary | ICD-10-CM

## 2021-04-05 DIAGNOSIS — Z124 Encounter for screening for malignant neoplasm of cervix: Secondary | ICD-10-CM

## 2021-04-05 LAB — LIPID PANEL
Cholesterol: 164 mg/dL (ref 0–200)
HDL: 62.5 mg/dL (ref >39.00)
LDL Cholesterol: 82 mg/dL (ref 0–99)
NonHDL: 101.09
Total CHOL/HDL Ratio: 3
Triglycerides: 95 mg/dL (ref 0.0–149.0)
VLDL: 19 mg/dL (ref 0.0–40.0)

## 2021-04-05 LAB — COMPREHENSIVE METABOLIC PANEL
ALT: 24 U/L (ref 0–35)
AST: 20 U/L (ref 0–37)
Albumin: 3.9 g/dL (ref 3.5–5.2)
Alkaline Phosphatase: 73 U/L (ref 39–117)
BUN: 11 mg/dL (ref 6–23)
CO2: 25 mEq/L (ref 19–32)
Calcium: 9.2 mg/dL (ref 8.4–10.5)
Chloride: 103 mEq/L (ref 96–112)
Creatinine, Ser: 0.59 mg/dL (ref 0.40–1.20)
GFR: 117.29 mL/min (ref >60.00)
Glucose, Bld: 99 mg/dL (ref 70–99)
Potassium: 4.4 mEq/L (ref 3.5–5.1)
Sodium: 137 mEq/L (ref 135–145)
Total Bilirubin: 0.3 mg/dL (ref 0.2–1.2)
Total Protein: 7.3 g/dL (ref 6.0–8.3)

## 2021-04-05 LAB — CBC WITH DIFFERENTIAL/PLATELET
Basophils Absolute: 0.1 10*3/uL (ref 0.0–0.1)
Basophils Relative: 1 % (ref 0.0–3.0)
Eosinophils Absolute: 0.2 10*3/uL (ref 0.0–0.7)
Eosinophils Relative: 3.3 % (ref 0.0–5.0)
HCT: 39.6 % (ref 36.0–46.0)
Hemoglobin: 13.1 g/dL (ref 12.0–15.0)
Lymphocytes Relative: 30.9 % (ref 12.0–46.0)
Lymphs Abs: 2.2 10*3/uL (ref 0.7–4.0)
MCHC: 33.1 g/dL (ref 30.0–36.0)
MCV: 87.1 fl (ref 78.0–100.0)
Monocytes Absolute: 0.5 10*3/uL (ref 0.1–1.0)
Monocytes Relative: 6.7 % (ref 3.0–12.0)
Neutro Abs: 4.2 10*3/uL (ref 1.4–7.7)
Neutrophils Relative %: 58.1 % (ref 43.0–77.0)
Platelets: 328 10*3/uL (ref 150.0–400.0)
RBC: 4.55 Mil/uL (ref 3.87–5.11)
RDW: 12.3 % (ref 11.5–15.5)
WBC: 7.2 10*3/uL (ref 4.0–10.5)

## 2021-04-05 LAB — VITAMIN D 25 HYDROXY (VIT D DEFICIENCY, FRACTURES): VITD: 26.07 ng/mL — ABNORMAL LOW (ref 30.00–100.00)

## 2021-04-05 LAB — VITAMIN B12: Vitamin B-12: 319 pg/mL (ref 211–911)

## 2021-04-05 LAB — TSH: TSH: 0.78 u[IU]/mL (ref 0.35–5.50)

## 2021-04-05 LAB — HEMOGLOBIN A1C: Hgb A1c MFr Bld: 5.9 % (ref 4.6–6.5)

## 2021-04-05 MED ORDER — ALBUTEROL SULFATE HFA 108 (90 BASE) MCG/ACT IN AERS
2.0000 | INHALATION_SPRAY | Freq: Four times a day (QID) | RESPIRATORY_TRACT | 11 refills | Status: DC | PRN
  Filled 2021-04-05: qty 18, 25d supply, fill #0
  Filled 2021-11-26: qty 18, 25d supply, fill #1

## 2021-04-05 MED ORDER — HYDROXYZINE HCL 25 MG PO TABS
25.0000 mg | ORAL_TABLET | Freq: Every evening | ORAL | 11 refills | Status: DC | PRN
  Filled 2021-04-05: qty 60, 30d supply, fill #0

## 2021-04-05 MED ORDER — CITALOPRAM HYDROBROMIDE 20 MG PO TABS
ORAL_TABLET | Freq: Every morning | ORAL | 3 refills | Status: DC
  Filled 2021-04-05 – 2021-05-29 (×2): qty 90, 90d supply, fill #0
  Filled 2021-08-21: qty 90, 90d supply, fill #1
  Filled 2021-10-30 (×3): qty 90, 90d supply, fill #2
  Filled 2022-03-08: qty 90, 90d supply, fill #3

## 2021-04-05 NOTE — Patient Instructions (Addendum)
B12 sublingual take this to keep B12 normal Exercise-Induced Bronchoconstriction, Adult  Exercise-induced bronchospasm (EIB) happens when the airways narrow during or after vigorous activity or exercise. The airways are the passages that lead from the nose and mouth down into the lungs. When the airways narrow, this can cause coughing, wheezing, and shortness of breath. This makes it hard to breathe. Anyone can develop EIB, even people who do not have allergies or asthma. With proper treatment, most people with EIB can be active and exercisenormally. What are the causes? The exact cause of this condition is not known. Symptoms are brought on (triggered) by physical activity. EIB can also be triggered by: Breathing very cold and dry or hot and humid air. Chemicals, such as chlorine in swimming pools, pesticides, or fertilizers. Outdoor triggers, such as: Air pollution. Car exhaust. Pollen from grass, trees, or flowers. Campfire smoke. Indoor triggers, such as: Dust. Mold. Tobacco smoke. Cleaning solutions. Animal dander. What increases the risk? You are more likely to develop this condition if you: Have asthma. Participate in sports that require constant motion, such as basketball, hockey, skiing, and swimming. Work outdoors. Exercise where there are higher levels of one or more EIB triggers. What are the signs or symptoms? Symptoms of this condition include: Coughing. Wheezing. Shortness of breath. Chest pain or tightness. Sore throat. Upset stomach. Symptoms may worsen after exercise has stopped. How is this diagnosed? EIB is diagnosed with your medical history and a physical exam. You may also have other tests, including: Lung function studies (spirometry). An exercise test to check for EIB symptoms. Allergy tests. How is this treated? Treatment for EIB includes preventing symptoms when possible, and treating EIB quickly when symptoms occur. Treatment may include: Taking  medicine that your health care provider prescribes. Medicine comes in different forms, including: Medicines that you breathe in (inhale). These include: Steroids. These help to control your symptoms and are usually taken every day. Quick relief medicines. These help to quickly relieve your breathing difficulty. Medicines that you take by mouth (orally). These help to control allergies and asthma. Avoiding triggers. Stopping physical activity or exercise to rest. Follow these instructions at home: Take over-the-counter and prescription medicines only as told by your health care provider. Do not use products that contain nicotine or tobacco, such as cigarettes, e-cigarettes, and chewing tobacco. If you need help quitting, ask your health care provider. Make changes in your workout as told by your health care provider. Exercise is important to your health and well-being. Keep quick relief medicine with you when you are exercising. Tell your exercise partners about your condition. Wear a medical ID bracelet. If you are planning to exercise alone or in an isolated area, let someone know where you are going and when you will be back. You may need to see a health care provider who specializes in allergies (allergist) or the lungs (pulmonologist) for more tests. Keep all follow-up visits as told by your health care provider. This is important. How is this prevented? Take medicines to prevent exercise-induced bronchospasm as told by your health care provider. Warm up before starting to play sports or exercise. Exercise indoors to avoid outdoor triggers. Cover your nose and mouth with a scarf to warm air that is very cold. Tell your workout partners or trainer about your condition. Tell them how to help you if you have an episode. Contact a health care provider if: You have coughing, wheezing, or shortness of breath that continues after treatment. Your coughing wakes you  up at night. You have less  endurance than you used to. Get help right away if: Your medicine is not helping you breathe better. You cannot catch your breath. You pass out. Summary Exercise-induced bronchospasm (EIB) happens when the airways narrow during or after exercise. When the airways narrow, this can cause coughing, wheezing, and shortness of breath. It can be difficult to breathe. Take over-the-counter and prescription medicines only as told by your health care provider. Make changes in your workout as told by your health care provider. Contact a health care provider if you continue to have trouble breathing after treatment. This information is not intended to replace advice given to you by your health care provider. Make sure you discuss any questions you have with your healthcare provider. Document Revised: 05/07/2018 Document Reviewed: 05/12/2018 Elsevier Patient Education  2022 [employer]  Vitamin B12 Deficiency Vitamin B12 deficiency occurs when the body does not have enough vitamin B12, which is an important vitamin. The body needs this vitamin: To make red blood cells. To make DNA. This is the genetic material inside cells. To help the nerves work properly so they can carry messages from the brain to the body. Vitamin B12 deficiency can cause various health problems, such as a low red blood cell count (anemia) or nerve damage. What are the causes? This condition may be caused by: Not eating enough foods that contain vitamin B12. Not having enough stomach acid and digestive fluids to properly absorb vitamin B12 from the food that you eat. Certain digestive system diseases that make it hard to absorb vitamin B12. These diseases include Crohn's disease, chronic pancreatitis, and cystic fibrosis. A condition in which the body does not make enough of a protein (intrinsic factor), resulting in too few red blood cells (pernicious anemia). Having a surgery in which part of the stomach or small intestine is  removed. Taking certain medicines that make it hard for the body to absorb vitamin B12. These medicines include: Heartburn medicines (antacids and proton pump inhibitors). Certain antibiotic medicines. Some medicines that are used to treat diabetes, tuberculosis, gout, or high cholesterol. What increases the risk? The following factors may make you more likely to develop a B12 deficiency: Being older than age 25. Eating a vegetarian or vegan diet, especially while you are pregnant. Eating a poor diet while you are pregnant. Taking certain medicines. Having alcoholism. What are the signs or symptoms? In some cases, there are no symptoms of this condition. If the condition leads to anemia or nerve damage, various symptoms can occur, such as: Weakness. Fatigue. Loss of appetite. Weight loss. Numbness or tingling in your hands and feet. Redness and burning of the tongue. Confusion or memory problems. Depression. Sensory problems, such as color blindness, ringing in the ears, or loss of taste. Diarrhea or constipation. Trouble walking. If anemia is severe, symptoms can include: Shortness of breath. Dizziness. Rapid heart rate (tachycardia). How is this diagnosed? This condition may be diagnosed with a blood test to measure the level of vitamin B12 in your blood. You may also have other tests, including: A group of tests that measure certain characteristics of blood cells (complete blood count, CBC). A blood test to measure intrinsic factor. A procedure where a thin tube with a camera on the end is used to look into your stomach or intestines (endoscopy). Other tests may be needed to discover the cause of B12 deficiency. How is this treated? Treatment for this condition depends on the cause. This condition may  be treated by: Changing your eating and drinking habits, such as: Eating more foods that contain vitamin B12. Drinking less alcohol or no alcohol. Getting vitamin B12  injections. Taking vitamin B12 supplements. Your health care provider will tell you which dosage is best for you. Follow these instructions at home: Eating and drinking  Eat lots of healthy foods that contain vitamin B12, including: Meats and poultry. This includes beef, pork, chicken, [place], and organ meats, such as liver. Seafood. This includes clams, rainbow trout, salmon, tuna, and haddock. Eggs. Cereal and dairy products that are fortified. This means that vitamin B12 has been added to the food. Check the label on the package to see if the food is fortified. The items listed above may not be a complete list of recommended foods and beverages. Contact a dietitian for more information. General instructions Get any injections that are prescribed by your health care provider. Take supplements only as told by your health care provider. Follow the directions carefully. Do not drink alcohol if your health care provider tells you not to. In some cases, you may only be asked to limit alcohol use. Keep all follow-up visits as told by your health care provider. This is important. Contact a health care provider if: Your symptoms come back. Get help right away if you: Develop shortness of breath. Have a rapid heart rate. Have chest pain. Become dizzy or lose consciousness. Summary Vitamin B12 deficiency occurs when the body does not have enough vitamin B12. The main causes of vitamin B12 deficiency include dietary deficiency, digestive diseases, pernicious anemia, and having a surgery in which part of the stomach or small intestine is removed. In some cases, there are no symptoms of this condition. If the condition leads to anemia or nerve damage, various symptoms can occur, such as weakness, shortness of breath, and numbness. Treatment may include getting vitamin B12 injections or taking vitamin B12 supplements. Eat lots of healthy foods that contain vitamin B12. This information is not  intended to replace advice given to you by your health care provider. Make sure you discuss any questions you have with your healthcare provider. Document Revised: 02/05/2019 Document Reviewed: 04/28/2018 Elsevier Patient Education  2022 [employer].

## 2021-04-05 NOTE — Progress Notes (Signed)
Chief Complaint  Patient presents with   Annual Exam   Annual  1. Anxiety controlled on celexa 20 mg qd not taking atarax ab pain helps  2. Obesity not working out but doing intermittent fasting  3. C/o wheezing with exercise wants to try albuterol inhaler as needed    Review of Systems  Constitutional:  Negative for weight loss.  HENT:  Negative for hearing loss.   Eyes:  Negative for blurred vision.  Respiratory:  Negative for shortness of breath.   Cardiovascular:  Negative for chest pain.  Gastrointestinal:  Negative for abdominal pain.  Musculoskeletal:  Negative for falls and joint pain.  Skin:  Negative for rash.  Neurological:  Negative for headaches.  Psychiatric/Behavioral:  Negative for depression.   Past Medical History:  Diagnosis Date   History of chicken pox    Vitamin D deficiency    Past Surgical History:  Procedure Laterality Date   CESAREAN SECTION     C sec 2004 and 2011    Family History  Problem Relation Age of Onset   Diabetes Maternal Grandmother    Hypertension Maternal Grandmother    Heart disease Maternal Grandfather    Cancer Paternal Grandfather        lung smoker    Social History   Socioeconomic History   Marital status: Married    Spouse name: Not on file   Number of children: Not on file   Years of education: Not on file   Highest education level: Not on file  Occupational History   Not on file  Tobacco Use   Smoking status: Never   Smokeless tobacco: Never  Substance and Sexual Activity   Alcohol use: Yes   Drug use: Not Currently   Sexual activity: Yes    Partners: Male  Other Topics Concern   Not on file  Social History Narrative   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[employer]   1 kids daughter 39, son 59 as of 04/05/20   Born in [place] until 35 y.o then lived in [place] now moved [place] with husband    College ed    No guns    Wears seat belt    Safe in relationship    Married    Social Determinants of Radio broadcast assistant  Strain: Not on file  Food Insecurity: Not on file  Transportation Needs: Not on file  Physical Activity: Not on file  Stress: Not on file  Social Connections: Not on file  Intimate Partner Violence: Not on file   Current Meds  Medication Sig   albuterol (VENTOLIN HFA) 108 (90 Base) MCG/ACT inhaler Inhale 2 puffs into the lungs every 6 (six) hours as needed for wheezing or shortness of breath.   Norgestimate-Ethinyl Estradiol Triphasic 0.18/0.215/0.25 MG-35 MCG tablet TAKE 1 TABLET BY MOUTH DAILY.   triamcinolone cream (KENALOG) 0.1 % Apply 1 application topically 2 (two) times daily. Right hand as needed   [DISCONTINUED] citalopram (CELEXA) 20 MG tablet TAKE 1 TABLET BY MOUTH DAILY IN THE MORNING   [DISCONTINUED] Cyanocobalamin 500 MCG/0.1ML SOLN USE 1 SPRAY IN ONE NOSTRIL ONCE WEEKLY   [DISCONTINUED] hydrOXYzine (ATARAX/VISTARIL) 25 MG tablet Take 1-2 tablets (25-50 mg total) by mouth at bedtime as needed.   No Known Allergies No results found for this or any previous visit (from the past 2160 hour(s)). Objective  Body mass index is 38.27 kg/m. Wt Readings from Last 3 Encounters:  04/05/21 223 lb 9.6 oz (101.4 kg)  04/05/20 209  lb 12.8 oz (95.2 kg)  03/21/20 210 lb 12.8 oz (95.6 kg)   Temp Readings from Last 3 Encounters:  04/05/21 (!) 96.3 F (35.7 C) (Temporal)  04/05/20 98.1 F (36.7 C) (Oral)  03/21/20 97.9 F (36.6 C) (Oral)   BP Readings from Last 3 Encounters:  04/05/21 120/80  04/05/20 130/80  03/21/20 124/80   Pulse Readings from Last 3 Encounters:  04/05/21 74  04/05/20 71  03/21/20 78    Physical Exam Vitals and nursing note reviewed.  Constitutional:      Appearance: Normal appearance. She is well-developed and well-groomed. She is obese.  HENT:     Head: Normocephalic and atraumatic.  Eyes:     Conjunctiva/sclera: Conjunctivae normal.     Pupils: Pupils are equal, round, and reactive to light.  Cardiovascular:     Rate and Rhythm: Normal rate and  regular rhythm.     Heart sounds: Normal heart sounds. No murmur heard. Pulmonary:     Effort: Pulmonary effort is normal.     Breath sounds: Normal breath sounds.  Abdominal:     Tenderness: There is no abdominal tenderness.  Skin:    General: Skin is warm and dry.  Neurological:     General: No focal deficit present.     Mental Status: She is alert and oriented to person, place, and time. Mental status is at baseline.     Gait: Gait normal.  Psychiatric:        Attention and Perception: Attention and perception normal.        Mood and Affect: Mood and affect normal.        Speech: Speech normal.        Behavior: Behavior normal. Behavior is cooperative.        Thought Content: Thought content normal.        Cognition and Memory: Cognition and memory normal.        Judgment: Judgment normal.    Assessment  Plan  Annual physical exam - Plan: Comprehensive metabolic panel, Lipid panel, CBC with Differential/Platelet, TSH, Urinalysis, Routine w reflex microscopic Flu shot had likely at work hospital  Tdap ~2017 check [facility]  Had 3/3covid pfizer will sent  Declines STD check, MMR and hep B had with job Fatty liver check hep B titer at work f/u check hep A and C    Reviewed labs rec healthy diet choices and exercise Pap 07/16/18 [facility]  Neg neg HPV on OCP prev had nexplanon referred  mammo age 76 Colonoscopy age 28    rec meditation, aromatherapy for anxiety which is now improved rec D3 5000 IU with MVT vitafusion qd  B12 SL otc  Rec healthy diet and exercise  Anxiety  better- Plan: citalopram (CELEXA) 20 MG tablet, hydrOXYzine (ATARAX/VISTARIL) 25-50 MG tablet not taking but wants assess if needed  B12 deficiency - Plan: Vitamin B12 rec take otc sl B12  Vitamin D deficiency - Plan: Vitamin D (25 hydroxy)  Hyperglycemia - Plan: Hemoglobin A1c  Routine cervical smear - Plan: Ambulatory referral to Obstetrics / Gynecology Left lower quadrant abdominal pain - Plan:  Ambulatory referral to Obstetrics / Gynecology ct ab pelvis neg 2021 consider w/u w/ ob/gyn   Exercise-induced asthma - Plan: albuterol (VENTOLIN HFA) 108 (90 Base) MCG/ACT inhaler    Provider: Dr. Olivia Mackie McLean-Scocuzza-[facility]

## 2021-04-06 LAB — URINALYSIS, ROUTINE W REFLEX MICROSCOPIC
Bilirubin Urine: NEGATIVE
Glucose, UA: NEGATIVE
Hgb urine dipstick: NEGATIVE
Ketones, ur: NEGATIVE
Leukocytes,Ua: NEGATIVE
Nitrite: NEGATIVE
Protein, ur: NEGATIVE
Specific Gravity, Urine: 1.005 (ref 1.001–1.035)
pH: 7 (ref 5.0–8.0)

## 2021-04-09 ENCOUNTER — Other Ambulatory Visit: Payer: Self-pay

## 2021-04-11 ENCOUNTER — Other Ambulatory Visit: Payer: No Typology Code available for payment source

## 2021-04-11 ENCOUNTER — Telehealth: Payer: Self-pay | Admitting: *Deleted

## 2021-04-11 NOTE — Telephone Encounter (Signed)
Pt coming in this afternoon for an x-ray. There are no orders. Please have orders in prior to patient arrival.

## 2021-04-11 NOTE — Telephone Encounter (Signed)
She is not getting Xray she was bringing her mother

## 2021-04-11 NOTE — Telephone Encounter (Signed)
Noted  

## 2021-05-29 ENCOUNTER — Other Ambulatory Visit: Payer: Self-pay

## 2021-05-29 MED FILL — Norgestimate-Eth Estrad Tab 0.18-35/0.215-35/0.25-35 MG-MCG: ORAL | 84 days supply | Qty: 84 | Fill #2 | Status: AC

## 2021-06-13 LAB — HM PAP SMEAR: HM Pap smear: NORMAL

## 2021-06-19 ENCOUNTER — Encounter: Payer: Self-pay | Admitting: Internal Medicine

## 2021-08-05 ENCOUNTER — Telehealth: Payer: No Typology Code available for payment source | Admitting: Family

## 2021-08-05 DIAGNOSIS — J029 Acute pharyngitis, unspecified: Secondary | ICD-10-CM | POA: Diagnosis not present

## 2021-08-05 MED ORDER — FLUTICASONE PROPIONATE 50 MCG/ACT NA SUSP
2.0000 | Freq: Every day | NASAL | 6 refills | Status: AC
  Filled 2021-08-05: qty 16, 30d supply, fill #0

## 2021-08-05 MED ORDER — CETIRIZINE HCL 10 MG PO TABS
10.0000 mg | ORAL_TABLET | Freq: Every day | ORAL | 11 refills | Status: DC
Start: 2021-08-05 — End: 2022-05-02
  Filled 2021-08-05: qty 30, 30d supply, fill #0

## 2021-08-05 NOTE — Progress Notes (Signed)
E-Visit for Sore Throat  We are sorry that you are not feeling well.  Here is how we plan to help!  Your symptoms indicate a likely viral infection (Pharyngitis).   Pharyngitis is inflammation in the back of the throat which can cause a sore throat, scratchiness and sometimes difficulty swallowing.   Pharyngitis is typically caused by a respiratory virus and will just run its course.  Please keep in mind that your symptoms could last up to 10 days.  For throat pain, we recommend over the counter oral pain relief medications such as acetaminophen or aspirin, or anti-inflammatory medications such as ibuprofen or naproxen sodium.  Topical treatments such as oral throat lozenges or sprays may be used as needed.  Avoid close contact with loved ones, especially the very young and elderly.  Remember to wash your hands thoroughly throughout the day as this is the number one way to prevent the spread of infection and wipe down door knobs and counters with disinfectant.  I have sent in flonase nasal spray and zyrtec 10 mg daily.   After careful review of your answers, I would not recommend and antibiotic for your condition.  Antibiotics should not be used to treat conditions that we suspect are caused by viruses like the virus that causes the common cold or flu. However, some people can have Strep with atypical symptoms. You may need formal testing in clinic or office to confirm if your symptoms continue or worsen.  Providers prescribe antibiotics to treat infections caused by bacteria. Antibiotics are very powerful in treating bacterial infections when they are used properly.  To maintain their effectiveness, they should be used only when necessary.  Overuse of antibiotics has resulted in the development of super bugs that are resistant to treatment!    Home Care: Only take medications as instructed by your medical team. Do not drink alcohol while taking these medications. A steam or ultrasonic humidifier  can help congestion.  You can place a towel over your head and breathe in the steam from hot water coming from a faucet. Avoid close contacts especially the very young and the elderly. Cover your mouth when you cough or sneeze. Always remember to wash your hands.  Get Help Right Away If: You develop worsening fever or throat pain. You develop a severe head ache or visual changes. Your symptoms persist after you have completed your treatment plan.  Make sure you Understand these instructions. Will watch your condition. Will get help right away if you are not doing well or get worse.   Thank you for choosing an e-visit.  Your e-visit answers were reviewed by a board certified advanced clinical practitioner to complete your personal care plan. Depending upon the condition, your plan could have included both over the counter or prescription medications.  Please review your pharmacy choice. Make sure the pharmacy is open so you can pick up prescription now. If there is a problem, you may contact your provider through [employer] and have the prescription routed to another pharmacy.  Your safety is important to [place]. If you have drug allergies check your prescription carefully.   For the next 24 hours you can use MyChart to ask questions about today's visit, request a non-urgent call back, or ask for a work or school excuse. You will get an email in the next two days asking about your experience. I hope that your e-visit has been valuable and will speed your recovery.  Approximately 5 minutes was spent documenting  and reviewing patient's chart.

## 2021-08-06 ENCOUNTER — Other Ambulatory Visit: Payer: Self-pay

## 2021-08-17 ENCOUNTER — Encounter: Payer: Self-pay | Admitting: Internal Medicine

## 2021-08-21 ENCOUNTER — Other Ambulatory Visit: Payer: Self-pay

## 2021-08-21 ENCOUNTER — Other Ambulatory Visit: Payer: Self-pay | Admitting: Internal Medicine

## 2021-08-21 MED ORDER — NORGESTIM-ETH ESTRAD TRIPHASIC 0.18/0.215/0.25 MG-35 MCG PO TABS
1.0000 | ORAL_TABLET | Freq: Every day | ORAL | 3 refills | Status: DC
  Filled 2021-08-21: qty 84, 84d supply, fill #0
  Filled 2021-10-30: qty 84, 84d supply, fill #1
  Filled 2022-02-01: qty 84, 84d supply, fill #2

## 2021-10-11 ENCOUNTER — Encounter: Payer: Self-pay | Admitting: Internal Medicine

## 2021-10-30 ENCOUNTER — Other Ambulatory Visit: Payer: Self-pay

## 2021-11-26 ENCOUNTER — Other Ambulatory Visit: Payer: Self-pay

## 2022-01-16 ENCOUNTER — Telehealth: Payer: No Typology Code available for payment source | Admitting: Physician Assistant

## 2022-01-16 ENCOUNTER — Other Ambulatory Visit: Payer: Self-pay

## 2022-01-16 DIAGNOSIS — R3989 Other symptoms and signs involving the genitourinary system: Secondary | ICD-10-CM | POA: Diagnosis not present

## 2022-01-16 MED ORDER — CEPHALEXIN 500 MG PO CAPS
500.0000 mg | ORAL_CAPSULE | Freq: Two times a day (BID) | ORAL | 0 refills | Status: DC
  Filled 2022-01-16: qty 14, 7d supply, fill #0

## 2022-01-16 NOTE — Progress Notes (Signed)

## 2022-01-30 ENCOUNTER — Other Ambulatory Visit (HOSPITAL_COMMUNITY)
Admission: RE | Admit: 2022-01-30 | Discharge: 2022-01-30 | Disposition: A | Discharge status: Home / Self Care | Payer: No Typology Code available for payment source | Source: Ambulatory Visit | Attending: Internal Medicine | Admitting: Internal Medicine

## 2022-01-30 ENCOUNTER — Encounter: Payer: Self-pay | Admitting: Internal Medicine

## 2022-01-30 ENCOUNTER — Ambulatory Visit (INDEPENDENT_AMBULATORY_CARE_PROVIDER_SITE_OTHER): Payer: No Typology Code available for payment source | Admitting: Internal Medicine

## 2022-01-30 VITALS — BP 110/70 | HR 74 | Temp 98.2°F | Resp 14 | Ht 64.09 in | Wt 228.4 lb

## 2022-01-30 DIAGNOSIS — N3000 Acute cystitis without hematuria: Secondary | ICD-10-CM

## 2022-01-30 DIAGNOSIS — R300 Dysuria: Secondary | ICD-10-CM | POA: Diagnosis present

## 2022-01-30 DIAGNOSIS — E669 Obesity, unspecified: Secondary | ICD-10-CM

## 2022-01-30 DIAGNOSIS — N760 Acute vaginitis: Secondary | ICD-10-CM | POA: Diagnosis not present

## 2022-01-30 NOTE — Patient Instructions (Signed)
Tru you supplement For uti prevention  Urinary Tract Infection, Adult  A urinary tract infection (UTI) is an infection of any part of the urinary tract. The urinary tract includes the kidneys, ureters, bladder, and urethra. These organs make, store, and get rid of urine in the body. An upper UTI affects the ureters and kidneys. A lower UTI affects the bladder and urethra. What are the causes? Most urinary tract infections are caused by bacteria in your genital area around your urethra, where urine leaves your body. These bacteria grow and cause inflammation of your urinary tract. What increases the risk? You are more likely to develop this condition if: You have a urinary catheter that stays in place. You are not able to control when you urinate or have a bowel movement (incontinence). You are female and you: Use a spermicide or diaphragm for birth control. Have low estrogen levels. Are pregnant. You have certain genes that increase your risk. You are sexually active. You take antibiotic medicines. You have a condition that causes your flow of urine to slow down, such as: An enlarged prostate, if you are female. Blockage in your urethra. A kidney stone. A nerve condition that affects your bladder control (neurogenic bladder). Not getting enough to drink, or not urinating often. You have certain medical conditions, such as: Diabetes. A weak disease-fighting system (immunesystem). Sickle cell disease. Gout. Spinal cord injury. What are the signs or symptoms? Symptoms of this condition include: Needing to urinate right away (urgency). Frequent urination. This may include small amounts of urine each time you urinate. Pain or burning with urination. Blood in the urine. Urine that smells bad or unusual. Trouble urinating. Cloudy urine. Vaginal discharge, if you are female. Pain in the abdomen or the lower back. You may also have: Vomiting or a decreased  appetite. Confusion. Irritability or tiredness. A fever or chills. Diarrhea. The first symptom in older adults may be confusion. In some cases, they may not have any symptoms until the infection has worsened. How is this diagnosed? This condition is diagnosed based on your medical history and a physical exam. You may also have other tests, including: Urine tests. Blood tests. Tests for STIs (sexually transmitted infections). If you have had more than one UTI, a cystoscopy or imaging studies may be done to determine the cause of the infections. How is this treated? Treatment for this condition includes: Antibiotic medicine. Over-the-counter medicines to treat discomfort. Drinking enough water to stay hydrated. If you have frequent infections or have other conditions such as a kidney stone, you may need to see a health care provider who specializes in the urinary tract (urologist). In rare cases, urinary tract infections can cause sepsis. Sepsis is a life-threatening condition that occurs when the body responds to an infection. Sepsis is treated in the hospital with IV antibiotics, fluids, and other medicines. Follow these instructions at home:  Medicines Take over-the-counter and prescription medicines only as told by your health care provider. If you were prescribed an antibiotic medicine, take it as told by your health care provider. Do not stop using the antibiotic even if you start to feel better. General instructions Make sure you: Empty your bladder often and completely. Do not hold urine for long periods of time. Empty your bladder after sex. Wipe from front to back after urinating or having a bowel movement if you are female. Use each tissue only one time when you wipe. Drink enough fluid to keep your urine pale yellow. Keep all follow-up  visits. This is important. Contact a health care provider if: Your symptoms do not get better after 1-2 days. Your symptoms go away and then  return. Get help right away if: You have severe pain in your back or your lower abdomen. You have a fever or chills. You have nausea or vomiting. Summary A urinary tract infection (UTI) is an infection of any part of the urinary tract, which includes the kidneys, ureters, bladder, and urethra. Most urinary tract infections are caused by bacteria in your genital area. Treatment for this condition often includes antibiotic medicines. If you were prescribed an antibiotic medicine, take it as told by your health care provider. Do not stop using the antibiotic even if you start to feel better. Keep all follow-up visits. This is important. This information is not intended to replace advice given to you by your health care provider. Make sure you discuss any questions you have with your health care provider. Document Revised: 03/31/2020 Document Reviewed: 03/31/2020 Elsevier Patient Education  [place].

## 2022-01-30 NOTE — Progress Notes (Signed)
Chief Complaint  Patient presents with   Urinary Tract Infection    Pt arrives c/o pain,discomfort, & pressure in vaginal area ever since having urinary sx's since 01/16/22. Had an E-visit and was given keflex 500 mg 2x x 7 days which she finished but sx's didn't clear up.    F/u  1. UTI virtual visit 01/16/22 had keflex 500 mg bid x 1 week with suprapubic pain pressure discomfort no current burning or frequency finished Abx still having sxs pressure in private area and feeling like cervix swollen she had burning and urgency and reduced urination and had sxs 2 weeks prior and had right flank pain also had blood tinge on tissue paper with wiping and 01/29/22 had cramping and watery blood tinge on tissue paper She does not have odor H/o UTI  She is due to have her cycle Monday   Review of Systems  Constitutional:  Negative for weight loss.  HENT:  Negative for hearing loss.   Eyes:  Negative for blurred vision.  Respiratory:  Negative for shortness of breath.   Cardiovascular:  Negative for chest pain.  Gastrointestinal:  Positive for abdominal pain. Negative for blood in stool.  Genitourinary:  Positive for dysuria, flank pain, frequency, hematuria and urgency.  Musculoskeletal:  Negative for falls and joint pain.  Skin:  Negative for rash.  Neurological:  Negative for headaches.  Psychiatric/Behavioral:  Negative for depression.   Past Medical History:  Diagnosis Date   History of chicken pox    UTI (urinary tract infection)    Vitamin D deficiency    Past Surgical History:  Procedure Laterality Date   CESAREAN SECTION     C sec 2004 and 2011    Family History  Problem Relation Age of Onset   Diabetes Maternal Grandmother    Hypertension Maternal Grandmother    Heart disease Maternal Grandfather    Cancer Paternal Grandfather        lung smoker    Social History   Socioeconomic History   Marital status: Married    Spouse name: Not on file   Number of children: Not on file    Years of education: Not on file   Highest education level: Not on file  Occupational History   Not on file  Tobacco Use   Smoking status: Never   Smokeless tobacco: Never  Substance and Sexual Activity   Alcohol use: Yes   Drug use: Not Currently   Sexual activity: Yes    Partners: Male  Other Topics Concern   Not on file  Social History Narrative   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, 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   Born in [place] until 36 y.o then lived in [place] now moved [place] with husband    College ed    No guns    Wears seat belt    Safe in relationship    Married    Social Determinants of Radio broadcast assistant Strain: Not on file  Food Insecurity: Not on file  Transportation Needs: Not on file  Physical Activity: Not on file  Stress: Not on file  Social Connections: Not on file  Intimate Partner Violence: Not on file   Current Meds  Medication Sig   albuterol (VENTOLIN HFA) 108 (90 Base) MCG/ACT inhaler Inhale 2 puffs into the lungs every 6 (six) hours as needed for wheezing or shortness of breath.   citalopram (CELEXA) 20 MG tablet TAKE 1 TABLET  BY MOUTH DAILY IN THE MORNING   fluticasone (FLONASE) 50 MCG/ACT nasal spray Place 2 sprays into both nostrils daily.   hydrOXYzine (ATARAX/VISTARIL) 25 MG tablet Take 1-2 tablets (25-50 mg total) by mouth at bedtime as needed.   Norgestimate-Ethinyl Estradiol Triphasic (TRI-SPRINTEC) 0.18/0.215/0.25 MG-35 MCG tablet TAKE 1 TABLET BY MOUTH DAILY.   triamcinolone cream (KENALOG) 0.1 % Apply 1 application topically 2 (two) times daily. Right hand as needed   No Known Allergies No results found for this or any previous visit (from the past 2160 hour(s)). Objective  Body mass index is 39.09 kg/m. Wt Readings from Last 3 Encounters:  01/30/22 228 lb 6.4 oz (103.6 kg)  04/05/21 223 lb 9.6 oz (101.4 kg)  04/05/20 209 lb 12.8 oz (95.2 kg)   Temp Readings from Last 3 Encounters:  01/30/22 98.2 F (36.8 C) (Oral)   04/05/21 (!) 96.3 F (35.7 C) (Temporal)  04/05/20 98.1 F (36.7 C) (Oral)   BP Readings from Last 3 Encounters:  01/30/22 110/70  04/05/21 120/80  04/05/20 130/80   Pulse Readings from Last 3 Encounters:  01/30/22 74  04/05/21 74  04/05/20 71    Physical Exam Vitals and nursing note reviewed.  Constitutional:      Appearance: Normal appearance. She is well-developed and well-groomed.  HENT:     Head: Normocephalic and atraumatic.  Eyes:     Conjunctiva/sclera: Conjunctivae normal.     Pupils: Pupils are equal, round, and reactive to light.  Cardiovascular:     Rate and Rhythm: Normal rate and regular rhythm.     Heart sounds: Normal heart sounds. No murmur heard. Pulmonary:     Effort: Pulmonary effort is normal.     Breath sounds: Normal breath sounds.  Abdominal:     General: Abdomen is flat. Bowel sounds are normal.     Tenderness: There is no abdominal tenderness.  Genitourinary:    Pubic Area: No rash.      Labia:        Right: No rash.        Left: No rash.      Vagina: Normal.     Cervix: Discharge present.     Comments: White thin discharge Musculoskeletal:        General: No tenderness.  Skin:    General: Skin is warm and dry.  Neurological:     General: No focal deficit present.     Mental Status: She is alert and oriented to person, place, and time. Mental status is at baseline.     Cranial Nerves: Cranial nerves 2-12 are intact.     Motor: Motor function is intact.     Coordination: Coordination is intact.     Gait: Gait is intact.  Psychiatric:        Attention and Perception: Attention and perception normal.        Mood and Affect: Mood and affect normal.        Speech: Speech normal.        Behavior: Behavior normal. Behavior is cooperative.        Thought Content: Thought content normal.        Cognition and Memory: Cognition and memory normal.        Judgment: Judgment normal.    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 Olivia Mackie McLean-Scocuzza-Internal Medicine

## 2022-01-31 LAB — URINALYSIS, ROUTINE W REFLEX MICROSCOPIC
Bacteria, UA: NONE SEEN /HPF
Bilirubin Urine: NEGATIVE
Glucose, UA: NEGATIVE
Hgb urine dipstick: NEGATIVE
Hyaline Cast: NONE SEEN /LPF
Ketones, ur: NEGATIVE
Nitrite: NEGATIVE
Protein, ur: NEGATIVE
RBC / HPF: NONE SEEN /HPF (ref 0–2)
Specific Gravity, Urine: 1.015 (ref 1.001–1.035)
pH: 6.5 (ref 5.0–8.0)

## 2022-01-31 LAB — URINE CULTURE
MICRO NUMBER:: 13464770
SPECIMEN QUALITY:: ADEQUATE

## 2022-01-31 LAB — MICROSCOPIC MESSAGE

## 2022-02-01 ENCOUNTER — Telehealth: Payer: Self-pay

## 2022-02-01 ENCOUNTER — Other Ambulatory Visit: Payer: Self-pay

## 2022-02-01 ENCOUNTER — Other Ambulatory Visit: Payer: Self-pay | Admitting: Internal Medicine

## 2022-02-01 DIAGNOSIS — N760 Acute vaginitis: Secondary | ICD-10-CM

## 2022-02-01 LAB — CERVICOVAGINAL ANCILLARY ONLY
Bacterial Vaginitis (gardnerella): POSITIVE — AB
Candida Glabrata: NEGATIVE
Candida Vaginitis: NEGATIVE
Comment: NEGATIVE
Comment: NEGATIVE
Comment: NEGATIVE

## 2022-02-01 MED ORDER — METRONIDAZOLE 500 MG PO TABS
500.0000 mg | ORAL_TABLET | Freq: Two times a day (BID) | ORAL | 0 refills | Status: DC
  Filled 2022-02-01: qty 14, 7d supply, fill #0

## 2022-02-01 NOTE — Telephone Encounter (Signed)
Lvm for pt to return call in regards to urine results.  Per Dr.Tracy: No UTI  + bacterial vaginosis  Sent flagyl 500 mg 2x per day x 1 week with food

## 2022-03-04 ENCOUNTER — Encounter: Payer: Self-pay | Admitting: Internal Medicine

## 2022-03-07 ENCOUNTER — Other Ambulatory Visit: Payer: Self-pay

## 2022-03-07 ENCOUNTER — Telehealth: Payer: Self-pay

## 2022-03-07 MED ORDER — SAXENDA 18 MG/3ML ~~LOC~~ SOPN
0.6000 mg | PEN_INJECTOR | Freq: Every day | SUBCUTANEOUS | 3 refills | Status: DC
Start: 2022-03-07 — End: 2022-05-02
  Filled 2022-03-07: qty 15, 30d supply, fill #0
  Filled 2022-03-07: qty 15, 150d supply, fill #0
  Filled 2022-03-08: qty 15, 30d supply, fill #0
  Filled 2022-03-12: qty 15, 45d supply, fill #0
  Filled 2022-04-24: qty 15, 30d supply, fill #1

## 2022-03-07 MED ORDER — INSULIN PEN NEEDLE 31G X 8 MM MISC
1.0000 | Freq: Every day | 3 refills | Status: DC
Start: 2022-03-07 — End: 2022-05-02
  Filled 2022-03-07: qty 90, 9d supply, fill #0
  Filled 2022-03-12: qty 100, 90d supply, fill #0

## 2022-03-07 NOTE — Telephone Encounter (Signed)
PA has been started via covermymeds on 03/07/22 for pt's Saxenda 18MG /3ML pen-injectors  Rx [id number]  Awaiting denial or approval.

## 2022-03-07 NOTE — Addendum Note (Signed)
Addended by: Quentin Ore on: 03/07/2022 01:03 PM   Modules accepted: Orders

## 2022-03-08 ENCOUNTER — Other Ambulatory Visit: Payer: Self-pay

## 2022-03-12 ENCOUNTER — Other Ambulatory Visit: Payer: Self-pay

## 2022-03-13 ENCOUNTER — Other Ambulatory Visit: Payer: Self-pay

## 2022-04-02 ENCOUNTER — Ambulatory Visit: Payer: No Typology Code available for payment source | Admitting: Internal Medicine

## 2022-04-05 ENCOUNTER — Ambulatory Visit: Payer: No Typology Code available for payment source | Admitting: Internal Medicine

## 2022-04-09 ENCOUNTER — Encounter: Payer: No Typology Code available for payment source | Admitting: Internal Medicine

## 2022-04-24 ENCOUNTER — Other Ambulatory Visit: Payer: Self-pay

## 2022-05-02 ENCOUNTER — Encounter: Payer: Self-pay | Admitting: Internal Medicine

## 2022-05-02 ENCOUNTER — Ambulatory Visit (INDEPENDENT_AMBULATORY_CARE_PROVIDER_SITE_OTHER): Payer: No Typology Code available for payment source | Admitting: Internal Medicine

## 2022-05-02 ENCOUNTER — Ambulatory Visit (INDEPENDENT_AMBULATORY_CARE_PROVIDER_SITE_OTHER): Payer: No Typology Code available for payment source

## 2022-05-02 ENCOUNTER — Other Ambulatory Visit: Payer: Self-pay

## 2022-05-02 VITALS — BP 116/80 | HR 76 | Temp 98.2°F | Ht 64.37 in | Wt 207.4 lb

## 2022-05-02 DIAGNOSIS — Z113 Encounter for screening for infections with a predominantly sexual mode of transmission: Secondary | ICD-10-CM

## 2022-05-02 DIAGNOSIS — N912 Amenorrhea, unspecified: Secondary | ICD-10-CM

## 2022-05-02 DIAGNOSIS — Z01818 Encounter for other preprocedural examination: Secondary | ICD-10-CM

## 2022-05-02 DIAGNOSIS — F419 Anxiety disorder, unspecified: Secondary | ICD-10-CM | POA: Diagnosis not present

## 2022-05-02 DIAGNOSIS — J45990 Exercise induced bronchospasm: Secondary | ICD-10-CM

## 2022-05-02 DIAGNOSIS — E559 Vitamin D deficiency, unspecified: Secondary | ICD-10-CM | POA: Diagnosis not present

## 2022-05-02 DIAGNOSIS — J029 Acute pharyngitis, unspecified: Secondary | ICD-10-CM

## 2022-05-02 DIAGNOSIS — E538 Deficiency of other specified B group vitamins: Secondary | ICD-10-CM | POA: Diagnosis not present

## 2022-05-02 DIAGNOSIS — Z1389 Encounter for screening for other disorder: Secondary | ICD-10-CM

## 2022-05-02 DIAGNOSIS — Z6835 Body mass index (BMI) 35.0-35.9, adult: Secondary | ICD-10-CM | POA: Insufficient documentation

## 2022-05-02 DIAGNOSIS — Z1329 Encounter for screening for other suspected endocrine disorder: Secondary | ICD-10-CM

## 2022-05-02 DIAGNOSIS — Z13818 Encounter for screening for other digestive system disorders: Secondary | ICD-10-CM

## 2022-05-02 DIAGNOSIS — Z0000 Encounter for general adult medical examination without abnormal findings: Secondary | ICD-10-CM | POA: Diagnosis not present

## 2022-05-02 DIAGNOSIS — E611 Iron deficiency: Secondary | ICD-10-CM | POA: Diagnosis not present

## 2022-05-02 DIAGNOSIS — R7303 Prediabetes: Secondary | ICD-10-CM

## 2022-05-02 DIAGNOSIS — Z20822 Contact with and (suspected) exposure to covid-19: Secondary | ICD-10-CM

## 2022-05-02 MED ORDER — ALBUTEROL SULFATE HFA 108 (90 BASE) MCG/ACT IN AERS
2.0000 | INHALATION_SPRAY | Freq: Four times a day (QID) | RESPIRATORY_TRACT | 11 refills | Status: AC | PRN
  Filled 2022-05-02: qty 6.7, 25d supply, fill #0

## 2022-05-02 MED ORDER — NORGESTIM-ETH ESTRAD TRIPHASIC 0.18/0.215/0.25 MG-35 MCG PO TABS
1.0000 | ORAL_TABLET | Freq: Every day | ORAL | 3 refills | Status: DC
  Filled 2022-05-02 – 2022-08-09 (×2): qty 84, 84d supply, fill #0
  Filled 2022-10-30: qty 84, 84d supply, fill #1
  Filled 2023-01-17: qty 84, 84d supply, fill #2
  Filled 2023-04-14: qty 84, 84d supply, fill #3

## 2022-05-02 MED ORDER — SAXENDA 18 MG/3ML ~~LOC~~ SOPN
3.0000 mg | PEN_INJECTOR | Freq: Every day | SUBCUTANEOUS | 11 refills | Status: DC
  Filled 2022-05-02 – 2022-08-09 (×2): qty 15, 30d supply, fill #0
  Filled 2022-10-30: qty 15, 30d supply, fill #1
  Filled 2022-12-04: qty 15, 30d supply, fill #2
  Filled 2023-01-17: qty 15, 30d supply, fill #3

## 2022-05-02 MED ORDER — INSULIN PEN NEEDLE 31G X 8 MM MISC
1.0000 | Freq: Every day | 3 refills | Status: DC
  Filled 2022-05-02 – 2022-09-13 (×2): qty 100, 90d supply, fill #0
  Filled 2022-12-17: qty 100, 90d supply, fill #1

## 2022-05-02 MED ORDER — CITALOPRAM HYDROBROMIDE 20 MG PO TABS
ORAL_TABLET | Freq: Every morning | ORAL | 3 refills | Status: DC
  Filled 2022-05-02: qty 90, fill #0
  Filled 2022-10-30: qty 90, 90d supply, fill #0

## 2022-05-02 MED ORDER — HYDROXYZINE HCL 25 MG PO TABS
25.0000 mg | ORAL_TABLET | Freq: Every evening | ORAL | 11 refills | Status: DC | PRN
  Filled 2022-05-02: qty 60, 30d supply, fill #0

## 2022-05-02 NOTE — Progress Notes (Signed)
Chief Complaint  Patient presents with   Annual Exam   Annual  1. Preop clearance as well for abdominoplasty extended 2 lipo areas, chin lipo pt researched and found Dr. Charm Barges in [place] [phone] sch 06/10/22 and she needs labs 3 weeks prior to surgery  She also needs CXR and EKG 2. Weight loss she was 140 before having daughter and then her lowest has been 165 lbs goal for now is <200 lbs and then 190s she is eating right and exercising on saxenda 3 mg daily  She has tried adipex in the past as well    Review of Systems  Constitutional:  Negative for weight loss.  HENT:  Negative for hearing loss.   Eyes:  Negative for blurred vision.  Respiratory:  Negative for shortness of breath.   Cardiovascular:  Negative for chest pain.  Gastrointestinal:  Negative for abdominal pain and blood in stool.  Genitourinary:  Negative for dysuria.  Musculoskeletal:  Negative for falls and joint pain.  Skin:  Negative for rash.  Neurological:  Negative for headaches.  Psychiatric/Behavioral:  Negative for depression.    Past Medical History:  Diagnosis Date   History of chicken pox    UTI (urinary tract infection)    Vitamin D deficiency    Past Surgical History:  Procedure Laterality Date   CESAREAN SECTION     C sec 2004 and 2011    Family History  Problem Relation Age of Onset   Diabetes Maternal Grandmother    Hypertension Maternal Grandmother    Heart disease Maternal Grandfather    Cancer Paternal Grandfather        lung smoker    Social History   Socioeconomic History   Marital status: Married    Spouse name: Not on file   Number of children: Not on file   Years of education: Not on file   Highest education level: Not on file  Occupational History   Not on file  Tobacco Use   Smoking status: Never   Smokeless tobacco: Never  Substance and Sexual Activity   Alcohol use: Yes   Drug use: Not Currently   Sexual activity: Yes    Partners: Male  Other Topics  Concern   Not on file  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 Yves Dill), son 16 as of 04/2022   Born in [place] until 36 y.o then lived in [place] now moved [place] with husband    College ed    No guns    Wears seat belt    Safe in relationship    Married    Social Determinants of Radio broadcast assistant Strain: Not on file  Food Insecurity: Not on file  Transportation Needs: Not on file  Physical Activity: Not on file  Stress: Not on file  Social Connections: Not on file  Intimate Partner Violence: Not on file   Current Meds  Medication Sig   fluticasone (FLONASE) 50 MCG/ACT nasal spray Place 2 sprays into both nostrils daily.   triamcinolone cream (KENALOG) 0.1 % Apply 1 application topically 2 (two) times daily. Right hand as needed   [DISCONTINUED] albuterol (VENTOLIN HFA) 108 (90 Base) MCG/ACT inhaler Inhale 2 puffs into the lungs every 6 (six) hours as needed for wheezing or shortness of breath.   [DISCONTINUED] citalopram (CELEXA) 20 MG tablet TAKE 1 TABLET BY MOUTH DAILY IN THE MORNING   [DISCONTINUED] hydrOXYzine (  ATARAX/VISTARIL) 25 MG tablet Take 1-2 tablets (25-50 mg total) by mouth at bedtime as needed.   [DISCONTINUED] Insulin Pen Needle 31G X 8 MM MISC Use to inject into the skin daily.   [DISCONTINUED] Liraglutide -Weight Management (SAXENDA) 18 MG/3ML SOPN Inject 0.6 mg into the skin daily. X 1week, 1.2 qd x 1 week, 1.8 qd x 1 week, 2.4 qd x 1 week, 3 mg qd x 1 week   [DISCONTINUED] metroNIDAZOLE (FLAGYL) 500 MG tablet Take 1 tablet (500 mg total) by mouth 2 (two) times daily with food   [DISCONTINUED] Norgestimate-Ethinyl Estradiol Triphasic (TRI-SPRINTEC) 0.18/0.215/0.25 MG-35 MCG tablet TAKE 1 TABLET BY MOUTH DAILY.   No Known Allergies No results found for this or any previous visit (from the past 2160 hour(s)). Objective  Body mass index is 35.19 kg/m. Wt Readings from Last 3 Encounters:  05/02/22 207 lb 6.4  oz (94.1 kg)  01/30/22 228 lb 6.4 oz (103.6 kg)  04/05/21 223 lb 9.6 oz (101.4 kg)   Temp Readings from Last 3 Encounters:  05/02/22 98.2 F (36.8 C) (Oral)  01/30/22 98.2 F (36.8 C) (Oral)  04/05/21 (!) 96.3 F (35.7 C) (Temporal)   BP Readings from Last 3 Encounters:  05/02/22 116/80  01/30/22 110/70  04/05/21 120/80   Pulse Readings from Last 3 Encounters:  05/02/22 76  01/30/22 74  04/05/21 74    Physical Exam Vitals and nursing note reviewed.  Constitutional:      Appearance: Normal appearance. She is well-developed and well-groomed.  HENT:     Head: Normocephalic and atraumatic.  Eyes:     Conjunctiva/sclera: Conjunctivae normal.     Pupils: Pupils are equal, round, and reactive to light.  Cardiovascular:     Rate and Rhythm: Normal rate and regular rhythm.     Heart sounds: Normal heart sounds. No murmur heard. Pulmonary:     Effort: Pulmonary effort is normal.     Breath sounds: Normal breath sounds.  Abdominal:     General: Abdomen is flat. Bowel sounds are normal.     Tenderness: There is no abdominal tenderness.  Musculoskeletal:        General: No tenderness.  Skin:    General: Skin is warm and dry.  Neurological:     General: No focal deficit present.     Mental Status: She is alert and oriented to person, place, and time. Mental status is at baseline.     Cranial Nerves: Cranial nerves 2-12 are intact.     Motor: Motor function is intact.     Coordination: Coordination is intact.     Gait: Gait is intact.  Psychiatric:        Attention and Perception: Attention and perception normal.        Mood and Affect: Mood and affect normal.        Speech: Speech normal.        Behavior: Behavior normal. Behavior is cooperative.        Thought Content: Thought content normal.        Cognition and Memory: Cognition and memory normal.        Judgment: Judgment normal.     Assessment  Plan  Annual physical exam -  See below needs pre-op labs for  surgery see hpi and annual labs   Anxiety - Plan: citalopram (CELEXA) 20 MG tablet, hydrOXYzine (ATARAX) 25 MG tablet  Exercise-induced asthma - Plan: albuterol (VENTOLIN HFA) 108 (90 Base) MCG/ACT inhaler   Prediabetes/bmi >35 -  Plan: Hemoglobin A1c, Liraglutide -Weight Management (SAXENDA) 18 MG/3ML SOPN, Insulin Pen Needle 31G X 8 MM MISC, c Wt is trending down on saxenda 3 mg qd reduces appetite and fasting and healthy diet choices and exercise   Pre-op evaluation - Plan: DG Chest 2 View normal EKG 12-Lead=NSR, Protime-INR, APTT, TSH, T4, free, T3 Uptake, Free Thyroxine See other labs  Pt is low risk for surgery stated in HPI and she understands the risks     HM Flu shot had likely at work hospital  Tdap ~2017 check [facility]  Had 3/3covid pfizer will send Declines STD check, MMR and hep B had with job Fatty liver check hep B titer at work f/u check hep A and C    Reviewed labs rec healthy diet choices and exercise Pap 07/16/18 [facility]  Neg neg  neg 06/23/21 negative  HPV on OCP prev had nexplanon referred  mammo age 13 Colonoscopy age 57    rec meditation, aromatherapy for anxiety which is now improved rec D3 5000 IU with MVT vitafusion qd  B12 SL otc  Rec healthy diet and exercise   Provider: Dr. Olivia Mackie McLean-Scocuzza-Internal Medicine

## 2022-05-12 ENCOUNTER — Encounter: Payer: Self-pay | Admitting: Internal Medicine

## 2022-05-16 ENCOUNTER — Encounter: Payer: Self-pay | Admitting: Internal Medicine

## 2022-05-20 ENCOUNTER — Other Ambulatory Visit (INDEPENDENT_AMBULATORY_CARE_PROVIDER_SITE_OTHER): Payer: No Typology Code available for payment source

## 2022-05-20 DIAGNOSIS — Z1389 Encounter for screening for other disorder: Secondary | ICD-10-CM

## 2022-05-20 DIAGNOSIS — Z01818 Encounter for other preprocedural examination: Secondary | ICD-10-CM

## 2022-05-20 DIAGNOSIS — Z113 Encounter for screening for infections with a predominantly sexual mode of transmission: Secondary | ICD-10-CM

## 2022-05-20 DIAGNOSIS — E611 Iron deficiency: Secondary | ICD-10-CM | POA: Diagnosis not present

## 2022-05-20 DIAGNOSIS — R7303 Prediabetes: Secondary | ICD-10-CM

## 2022-05-20 DIAGNOSIS — N912 Amenorrhea, unspecified: Secondary | ICD-10-CM

## 2022-05-20 DIAGNOSIS — Z1329 Encounter for screening for other suspected endocrine disorder: Secondary | ICD-10-CM

## 2022-05-20 DIAGNOSIS — E559 Vitamin D deficiency, unspecified: Secondary | ICD-10-CM

## 2022-05-20 DIAGNOSIS — Z13818 Encounter for screening for other digestive system disorders: Secondary | ICD-10-CM

## 2022-05-20 DIAGNOSIS — Z0000 Encounter for general adult medical examination without abnormal findings: Secondary | ICD-10-CM

## 2022-05-20 DIAGNOSIS — E538 Deficiency of other specified B group vitamins: Secondary | ICD-10-CM

## 2022-05-20 LAB — IBC + FERRITIN
Ferritin: 61 ng/mL (ref 10.0–291.0)
Iron: 71 ug/dL (ref 42–145)
Saturation Ratios: 20.4 % (ref 20.0–50.0)
TIBC: 348.6 ug/dL (ref 250.0–450.0)
Transferrin: 249 mg/dL (ref 212.0–360.0)

## 2022-05-21 ENCOUNTER — Other Ambulatory Visit: Payer: Self-pay | Admitting: *Deleted

## 2022-05-21 DIAGNOSIS — Z1329 Encounter for screening for other suspected endocrine disorder: Secondary | ICD-10-CM

## 2022-05-21 LAB — CBC WITH DIFFERENTIAL/PLATELET
Basophils Absolute: 0.1 10*3/uL (ref 0.0–0.2)
Basos: 1 %
EOS (ABSOLUTE): 0.2 10*3/uL (ref 0.0–0.4)
Eos: 2 %
Hematocrit: 40.2 % (ref 34.0–46.6)
Hemoglobin: 13.6 g/dL (ref 11.1–15.9)
Immature Grans (Abs): 0 10*3/uL (ref 0.0–0.1)
Immature Granulocytes: 0 %
Lymphocytes Absolute: 2.5 10*3/uL (ref 0.7–3.1)
Lymphs: 30 %
MCH: 29.5 pg (ref 26.6–33.0)
MCHC: 33.8 g/dL (ref 31.5–35.7)
MCV: 87 fL (ref 79–97)
Monocytes Absolute: 0.6 10*3/uL (ref 0.1–0.9)
Monocytes: 7 %
Neutrophils Absolute: 5.2 10*3/uL (ref 1.4–7.0)
Neutrophils: 60 %
Platelets: 336 10*3/uL (ref 150–450)
RBC: 4.61 x10E6/uL (ref 3.77–5.28)
RDW: 11.8 % (ref 11.7–15.4)
WBC: 8.6 10*3/uL (ref 3.4–10.8)

## 2022-05-21 LAB — COMPREHENSIVE METABOLIC PANEL
ALT: 25 IU/L (ref 0–32)
AST: 17 IU/L (ref 0–40)
Albumin/Globulin Ratio: 1.5 (ref 1.2–2.2)
Albumin: 4.3 g/dL (ref 3.9–4.9)
Alkaline Phosphatase: 94 IU/L (ref 44–121)
BUN/Creatinine Ratio: 11 (ref 9–23)
BUN: 7 mg/dL (ref 6–20)
Bilirubin Total: 0.3 mg/dL (ref 0.0–1.2)
CO2: 20 mmol/L (ref 20–29)
Calcium: 9.3 mg/dL (ref 8.7–10.2)
Chloride: 103 mmol/L (ref 96–106)
Creatinine, Ser: 0.61 mg/dL (ref 0.57–1.00)
Globulin, Total: 2.8 g/dL (ref 1.5–4.5)
Glucose: 90 mg/dL (ref 70–99)
Potassium: 4 mmol/L (ref 3.5–5.2)
Sodium: 138 mmol/L (ref 134–144)
Total Protein: 7.1 g/dL (ref 6.0–8.5)
eGFR: 119 mL/min/{1.73_m2} (ref >59)

## 2022-05-21 LAB — LIPID PANEL
Chol/HDL Ratio: 3.2 ratio (ref 0.0–4.4)
Cholesterol, Total: 157 mg/dL (ref 100–199)
HDL: 49 mg/dL (ref >39)
LDL Chol Calc (NIH): 93 mg/dL (ref 0–99)
Triglycerides: 80 mg/dL (ref 0–149)
VLDL Cholesterol Cal: 15 mg/dL (ref 5–40)

## 2022-05-21 LAB — IRON,TIBC AND FERRITIN PANEL
Ferritin: 100 ng/mL (ref 15–150)
Iron Saturation: 19 % (ref 15–55)
Iron: 62 ug/dL (ref 27–159)
Total Iron Binding Capacity: 323 ug/dL (ref 250–450)
UIBC: 261 ug/dL (ref 131–425)

## 2022-05-21 LAB — TSH: TSH: 0.851 u[IU]/mL (ref 0.450–4.500)

## 2022-05-21 LAB — URINALYSIS, ROUTINE W REFLEX MICROSCOPIC
Bilirubin, UA: NEGATIVE
Glucose, UA: NEGATIVE
Ketones, UA: NEGATIVE
Leukocytes,UA: NEGATIVE
Nitrite, UA: NEGATIVE
Protein,UA: NEGATIVE
RBC, UA: NEGATIVE
Specific Gravity, UA: 1.007 (ref 1.005–1.030)
Urobilinogen, Ur: 0.2 mg/dL (ref 0.2–1.0)
pH, UA: 7 (ref 5.0–7.5)

## 2022-05-21 LAB — HIV ANTIBODY (ROUTINE TESTING W REFLEX): HIV Screen 4th Generation wRfx: NONREACTIVE

## 2022-05-21 LAB — HEMOGLOBIN A1C
Est. average glucose Bld gHb Est-mCnc: 111 mg/dL
Hgb A1c MFr Bld: 5.5 % (ref 4.8–5.6)

## 2022-05-21 LAB — HEPATITIS B SURFACE ANTIGEN: Hepatitis B Surface Ag: NEGATIVE

## 2022-05-21 LAB — HCG, SERUM, QUALITATIVE: hCG,Beta Subunit,Qual,Serum: NEGATIVE m[IU]/mL (ref <6)

## 2022-05-21 LAB — HEPATITIS B SURFACE ANTIBODY, QUANTITATIVE: Hepatitis B Surf Ab Quant: 28 m[IU]/mL (ref >9.9)

## 2022-05-21 LAB — APTT: aPTT: 30 s (ref 24–33)

## 2022-05-21 LAB — HEPATITIS C ANTIBODY: Hep C Virus Ab: NONREACTIVE

## 2022-05-21 LAB — VITAMIN B12: Vitamin B-12: 495 pg/mL (ref 232–1245)

## 2022-05-21 LAB — HEPATITIS B CORE ANTIBODY, TOTAL: Hep B Core Total Ab: NEGATIVE

## 2022-05-21 LAB — T3 UPTAKE: T3 Uptake Ratio: 23 % — ABNORMAL LOW (ref 24–39)

## 2022-05-21 LAB — PROTIME-INR
INR: 0.9 (ref 0.9–1.2)
Prothrombin Time: 10.1 s (ref 9.1–12.0)

## 2022-05-21 LAB — VITAMIN D 25 HYDROXY (VIT D DEFICIENCY, FRACTURES): Vit D, 25-Hydroxy: 26.8 ng/mL — ABNORMAL LOW (ref 30.0–100.0)

## 2022-05-21 LAB — HEPATITIS A ANTIBODY, TOTAL: hep A Total Ab: POSITIVE — AB

## 2022-05-21 NOTE — Addendum Note (Signed)
Addended by: Neta Ehlers on: 05/21/2022 03:16 PM   Modules accepted: Orders

## 2022-05-21 NOTE — Telephone Encounter (Signed)
Pt called stating medical clearance and forms need to be faxed to [facility] when they are completed Fax [phone]

## 2022-05-22 ENCOUNTER — Telehealth: Payer: Self-pay | Admitting: Internal Medicine

## 2022-05-22 NOTE — Telephone Encounter (Signed)
I just checked and all of patient labs were all sent to [facility].

## 2022-05-22 NOTE — Telephone Encounter (Signed)
-----   Message from Delorise Jackson, MD sent at 05/20/2022  3:34 PM EDT ----- This was duplicate we will need to not charge for this if able

## 2022-05-23 ENCOUNTER — Telehealth: Payer: Self-pay

## 2022-05-23 LAB — SPECIMEN STATUS REPORT

## 2022-05-23 LAB — T4, FREE: Free T4: 1.2 ng/dL (ref 0.82–1.77)

## 2022-05-23 NOTE — Addendum Note (Signed)
Addended by: Orland Mustard on: 05/23/2022 02:09 PM   Modules accepted: Orders

## 2022-05-23 NOTE — Telephone Encounter (Signed)
Spoke with pt in regards to lab results and her surgery clearance form. Pt needed covid results prior to surgery as well. I got pt scheduled in lab for Covid test 10/2 @ 730 am.    Pt will also pick up her copy of the clearance form it is in an orange envelope in designated area labeled.

## 2022-05-27 NOTE — Telephone Encounter (Signed)
Patient called about her paperwork for medical clearance for surgery, Paper work has not been received. Patient would like a call back.

## 2022-05-28 ENCOUNTER — Telehealth: Payer: Self-pay

## 2022-05-28 NOTE — Telephone Encounter (Signed)
Today 9/26 I have faxed over to Dr. Ayesha Rumpf the pts surgery clearance to fax number [phone] with a confirmed transmission log.   Pt still needs covid test done and faxed over to the surgeon.

## 2022-05-29 LAB — FREE THYROXINE: Free Thyroxine: 0.92 ng/dL

## 2022-05-29 NOTE — Telephone Encounter (Signed)
I have faxed over the form with a confirmed transmission log.

## 2022-05-31 ENCOUNTER — Other Ambulatory Visit: Payer: Self-pay

## 2022-06-03 ENCOUNTER — Other Ambulatory Visit: Payer: No Typology Code available for payment source

## 2022-06-03 DIAGNOSIS — Z1329 Encounter for screening for other suspected endocrine disorder: Secondary | ICD-10-CM

## 2022-06-03 DIAGNOSIS — Z20822 Contact with and (suspected) exposure to covid-19: Secondary | ICD-10-CM

## 2022-06-05 LAB — NOVEL CORONAVIRUS, NAA: SARS-CoV-2, NAA: NOT DETECTED

## 2022-06-06 ENCOUNTER — Telehealth: Payer: Self-pay | Admitting: Internal Medicine

## 2022-06-06 NOTE — Telephone Encounter (Signed)
Patient called and would like her COVID result fax to [phone], Plastic surgery

## 2022-06-06 NOTE — Telephone Encounter (Signed)
This was faxed on yesterday with a completed trabslog and it has been documented sent pt a myChart msg as well

## 2022-06-24 ENCOUNTER — Telehealth: Payer: Self-pay | Admitting: Family Medicine

## 2022-06-24 NOTE — Telephone Encounter (Signed)
Agreed, patient should return to surgeon for removal.

## 2022-06-24 NOTE — Telephone Encounter (Signed)
Patient notified and voiced understanding. By repeating she will return to surgeon for removal.

## 2022-06-24 NOTE — Telephone Encounter (Signed)
Patient returned call from Kerin Salen, RN.  I transferred call to [facility].

## 2022-06-24 NOTE — Telephone Encounter (Signed)
Left message to call office

## 2022-06-24 NOTE — Telephone Encounter (Addendum)
Advised patient we need records stating when drain should be removed and that I would need for a physician in office to agree to removal of JP drain not a JPEG tube. Waiting for patient to send copy of records and to speak with future PCP and lead physician concerning removal first. To see if removal here would be appropriate or patient needs to return to [place] to surgeon to have removed.  Patient has transfer of care to Dr. Volanda Napoleon on 08/06/2022 but wants JP removed 10 /25 advised need records.

## 2022-06-24 NOTE — Telephone Encounter (Signed)
Patient had a tummy tuck 2 weeks ago in [place], [place] by Dr Mickie Hillier. They put a JPEG drainage in patient. Patient stayed for 2 weeks in [place], but had to return to [facility]. She was told that her provider or a nurse could remove the JPEG.  Patient has been schedule for a TOC in December with Dr Volanda Napoleon. Patient would like to know if Dr Volanda Napoleon could remove JPEG on Wednesday, 06/26/2022.

## 2022-06-28 ENCOUNTER — Telehealth: Payer: Self-pay

## 2022-06-28 NOTE — Telephone Encounter (Signed)
PA has been started via covermymeds on 06/28/22 for pt's Saxenda 18MG /3ML pen-injectors  Key: [id number]  Awaiting denial or approval

## 2022-08-06 ENCOUNTER — Encounter: Payer: No Typology Code available for payment source | Admitting: Family Medicine

## 2022-08-08 NOTE — Telephone Encounter (Signed)
Authorization approved for Saxenda.

## 2022-08-09 ENCOUNTER — Other Ambulatory Visit: Payer: Self-pay

## 2022-09-13 ENCOUNTER — Encounter: Payer: Self-pay | Admitting: Family

## 2022-09-13 ENCOUNTER — Telehealth (INDEPENDENT_AMBULATORY_CARE_PROVIDER_SITE_OTHER): Payer: 59 | Admitting: Family

## 2022-09-13 ENCOUNTER — Other Ambulatory Visit: Payer: Self-pay

## 2022-09-13 VITALS — Ht 64.0 in | Wt 190.0 lb

## 2022-09-13 DIAGNOSIS — E6609 Other obesity due to excess calories: Secondary | ICD-10-CM | POA: Diagnosis not present

## 2022-09-13 DIAGNOSIS — Z6832 Body mass index (BMI) 32.0-32.9, adult: Secondary | ICD-10-CM

## 2022-09-13 DIAGNOSIS — J40 Bronchitis, not specified as acute or chronic: Secondary | ICD-10-CM

## 2022-09-13 MED ORDER — PREDNISONE 20 MG PO TABS
40.0000 mg | ORAL_TABLET | Freq: Every day | ORAL | 0 refills | Status: DC
  Filled 2022-09-13: qty 10, 5d supply, fill #0

## 2022-09-13 MED ORDER — AZITHROMYCIN 250 MG PO TABS
ORAL_TABLET | ORAL | 0 refills | Status: AC
  Filled 2022-09-13: qty 6, 5d supply, fill #0

## 2022-09-13 NOTE — Progress Notes (Signed)
MyChart Video Visit    Virtual Visit via Video Note   This visit type was conducted due to national recommendations for restrictions regarding the COVID-19 Pandemic (e.g. social distancing) in an effort to limit this patient's exposure and mitigate transmission in our community. This patient is at least at moderate risk for complications without adequate follow up. This format is felt to be most appropriate for this patient at this time. Physical exam was limited by quality of the video and audio technology used for the visit. CMA was able to get the patient set up on a video visit.  Patient location: Home. Patient and provider in visit Provider location: Office  I discussed the limitations of evaluation and management by telemedicine and the availability of in person appointments. The patient expressed understanding and agreed to proceed.  Visit Date: 09/13/2022  Today's healthcare provider: Eugenia Pancoast, FNP     Subjective:    Patient ID: Wanda Davis, female    DOB: Jan 31, 1986, 37 y.o.   MRN: [id number]  Chief Complaint  Patient presents with   Cough    Started in November. A lot of exposure to rsv, flu and covid at work. Is currently having thick mucus at times. Can get nasua at time with cough. Can have wheezing at times. Trouble sleeping at night due to cough. Has been taking otc medications they are not helping much. No exam done. Did have fever when started but not in a last few weeks.     Cough    Pt here today via video visit with concerns.   Pt with cough chest congestion and wheezing at times. She states with her job she has a lot of daily exposure to Rsv, flu and covid at work. She has noticed a cough that was persistent but has continued on and has not yet improved. She feels she is always cough and with chest congestion. She feels at times that she has some crackles and wheezing as well. When she exercises she has to use her albuterol inhaler prior to  exercise.   No sinus pressure, no sore throat, runny nose at times.  Cough is productive, at first was thick and now clear- at times green- and thinner a bit.   She started benadryl which didn't provide her with much relief.  Also taking dayquil and nyquil.   Past Medical History:  Diagnosis Date   History of chicken pox    UTI (urinary tract infection)    Vitamin D deficiency     Past Surgical History:  Procedure Laterality Date   CESAREAN SECTION     C sec 2004 and 2011     Family History  Problem Relation Age of Onset   Diabetes Maternal Grandmother    Hypertension Maternal Grandmother    Heart disease Maternal Grandfather    Cancer Paternal Grandfather        lung smoker     Social History   Socioeconomic History   Marital status: Married    Spouse name: Not on file   Number of children: Not on file   Years of education: Not on file   Highest education level: Not on file  Occupational History   Not on file  Tobacco Use   Smoking status: Never   Smokeless tobacco: Never  Substance and Sexual Activity   Alcohol use: Yes   Drug use: Not Currently   Sexual activity: Yes    Partners: Male  Other Topics Concern  Not on file  Social History Narrative   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[employer]   2 kids daughter 73 as of 04/2022 Joanne Gavel), son 57 as of 04/2022   Born in [place] until 37 y.o then lived in [place] now moved [place] with husband    College ed    No guns    Wears seat belt    Safe in relationship    Married    Social Determinants of Corporate investment banker Strain: Not on file  Food Insecurity: Not on file  Transportation Needs: Not on file  Physical Activity: Not on file  Stress: Not on file  Social Connections: Not on file  Intimate Partner Violence: Not on file    Outpatient Medications Prior to Visit  Medication Sig Dispense Refill   albuterol (VENTOLIN HFA) 108 (90 Base) MCG/ACT inhaler Inhale 2 puffs into the lungs every 6 (six) hours as needed for  wheezing or shortness of breath. 6.7 g 11   citalopram (CELEXA) 20 MG tablet TAKE 1 TABLET BY MOUTH DAILY IN THE MORNING 90 tablet 3   fluticasone (FLONASE) 50 MCG/ACT nasal spray Place 2 sprays into both nostrils daily. 16 g 6   hydrOXYzine (ATARAX) 25 MG tablet Take 1-2 tablets (25-50 mg total) by mouth at bedtime as needed. 60 tablet 11   Insulin Pen Needle 31G X 8 MM MISC Use to inject into the skin daily. 100 each 3   Liraglutide -Weight Management (SAXENDA) 18 MG/3ML SOPN Inject 3 mg into the skin once daily. (Dose as of 05/02/22 3mg  daily) 15 mL 11   Norgestimate-Ethinyl Estradiol Triphasic (TRI-SPRINTEC) 0.18/0.215/0.25 MG-35 MCG tablet Take 1 tablet by mouth daily. 84 tablet 3   triamcinolone cream (KENALOG) 0.1 % Apply 1 application topically 2 (two) times daily. Right hand as needed 454 g 0   No facility-administered medications prior to visit.    No Known Allergies  Review of Systems  Respiratory:  Positive for cough.       ROS: Pertinent symptoms negative unless otherwise noted in HPI  Lab Results  Component Value Date   HGBA1C 5.5 05/20/2022    Objective:    Physical Exam Constitutional:      General: She is not in acute distress.    Appearance: Normal appearance. She is not ill-appearing or toxic-appearing.  Pulmonary:     Effort: Pulmonary effort is normal.  Neurological:     General: No focal deficit present.     Mental Status: She is alert and oriented to person, place, and time. Mental status is at baseline.  Psychiatric:        Mood and Affect: Mood normal.        Behavior: Behavior normal.        Thought Content: Thought content normal.        Judgment: Judgment normal.     Ht 5\' 4"  (1.626 m) Comment: per pt  Wt 190 lb (86.2 kg) Comment: per pt  BMI 32.61 kg/m  Wt Readings from Last 3 Encounters:  09/13/22 190 lb (86.2 kg)  05/02/22 207 lb 6.4 oz (94.1 kg)  01/30/22 228 lb 6.4 oz (103.6 kg)       Assessment & Plan:   Problem List Items  Addressed This Visit       Other   Class 1 obesity due to excess calories without serious comorbidity with body mass index (BMI) of 32.0 to 32.9 in adult   Other Visit Diagnoses     Bronchitis    -  Primary   Relevant Medications   azithromycin (ZITHROMAX) 250 MG tablet   predniSONE (DELTASONE) 20 MG tablet     Take antibiotic as prescribed. Increase oral fluids. Pt to f/u if sx worsen and or fail to improve in 2-3 days. Also recommend daily flonase 50 mcg as well as zyrtec 10 mg nightly.    I am having Quiara G. Handlin start on azithromycin and predniSONE. I am also having her maintain her triamcinolone cream, fluticasone, citalopram, hydrOXYzine, albuterol, Norgestimate-Ethinyl Estradiol Triphasic, Saxenda, and Insulin Pen Needle.  Meds ordered this encounter  Medications   azithromycin (ZITHROMAX) 250 MG tablet    Sig: Take 2 tablets on day 1, then 1 tablet daily on days 2 through 5    Dispense:  6 tablet    Refill:  0    Order Specific Question:   Supervising Provider    Answer:   BEDSOLE, AMY E [[id number]]   predniSONE (DELTASONE) 20 MG tablet    Sig: Take 2 tablets (40 mg total) by mouth daily for 5 days.    Dispense:  10 tablet    Refill:  0    Order Specific Question:   Supervising Provider    Answer:   BEDSOLE, AMY E [[id number]]    I discussed the assessment and treatment plan with the patient. The patient was provided an opportunity to ask questions and all were answered. The patient agreed with the plan and demonstrated an understanding of the instructions.   The patient was advised to call back or seek an in-person evaluation if the symptoms worsen or if the condition fails to improve as anticipated.  I provided 15 minutes of face-to-face time during this encounter.   Eugenia Pancoast, [facility] [phone] (phone) [phone] (fax)  [place]

## 2022-09-13 NOTE — Patient Instructions (Addendum)
  Recommend daily flonase nose spray and also nightly zyrtec to see if this helps.   Antibiotic sent to preferred pharmacy.   Please increase oral fluids, steamy hot shower/humidifier prn.  Please follow up if no improvement in 2-3 days.   It was a pleasure seeing you today! Please do not hesitate to reach out with any questions and or concerns.  Regards,   Eugenia Pancoast

## 2022-09-17 ENCOUNTER — Telehealth: Payer: Self-pay

## 2022-09-17 NOTE — Telephone Encounter (Signed)
Late entry Access nurse 09/13/22  [facility] [place] RECORD AccessNurse Patient Name: Wanda Davis Gender: Unknown DOB: [birth year] Age: 37 Y 2 M 20 D Return Phone Number: [phone] (Primary) Address: City/ State/ Zip: [place] [address] Client [place] [facility] Client Site [name] Provider Carollee Leitz- MD Contact Type Call Who Is Calling Patient / Member / Family / Caregiver Call Type Triage / Clinical Relationship To Patient Self Return Phone Number [phone] (Primary) Chief Complaint Cough Reason for Call Symptomatic / Request for [place] states she has a cough and nausea. Translation No Nurse Assessment Nurse: Alveta Heimlich, RN, Rise Paganini Date/Time (Eastern Time): 09/13/2022 8:30:42 AM Confirm and document reason for call. If symptomatic, describe symptoms. 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 She is pretty sure she had one of these around the holiday. Had had a cough since the beginning of December and is having nausea due to so much coughing. No fever. Does the patient have any new or worsening symptoms? ---Yes Will a triage be completed? ---Yes Related visit to physician within the last 2 weeks? ---No Does the PT have any chronic conditions? (i.e. diabetes, asthma, this includes High risk factors for pregnancy, etc.) ---No Is the patient pregnant or possibly pregnant? (Ask all females between the ages of 90-55) ---No Is this a behavioral health or substance abuse call? ---No Guidelines Guideline Title Affirmed Question Affirmed Notes Nurse Date/Time (Eastern Time) Cough - Acute Productive Wheezing is present Myer Haff 09/13/2022 8:33:43 AM PLEASE NOTE: All timestamps contained within this report are represented as [place] Standard Time. CONFIDENTIALTY NOTICE:  This fax transmission is intended only for the addressee. It contains information that is legally privileged, confidential or otherwise protected from use or disclosure. If you are not the intended recipient, you are strictly prohibited from reviewing, disclosing, copying using or disseminating any of this information or taking any action in reliance on or regarding this information. If you have received this fax in error, please notify us immediately by telephone so that we can arrange for its return to [place]. Phone: [phone], Toll-Free: [phone], Fax: [phone] Page: 2 of 2 Call Id: [id number] [place]. Time Eilene Ghazi Time) Disposition Final User 09/13/2022 8:36:14 AM See HCP within 4 Hours (or PCP triage) Yes Alveta Heimlich, RN, [facility] Final Disposition 09/13/2022 8:36:14 AM See HCP within 4 Hours (or PCP triage) Yes Alveta Heimlich, RN, [facility] Disagree/Comply Comply Caller Understands Yes PreDisposition InappropriateToAsk Care Advice Given Per Guideline SEE HCP (OR PCP TRIAGE) WITHIN 4 HOURS: * IF OFFICE WILL BE OPEN: You need to be seen within the next 3 or 4 hours. Call your doctor (or NP/PA) now or as soon as the office opens. CALL BACK IF: * You become worse CARE ADVICE given per Cough - Acute Productive (Adult) guideline. Referrals REFERRED TO PCP OFFIC

## 2022-10-30 ENCOUNTER — Other Ambulatory Visit: Payer: Self-pay

## 2022-12-04 ENCOUNTER — Other Ambulatory Visit: Payer: Self-pay

## 2022-12-16 ENCOUNTER — Other Ambulatory Visit: Payer: Self-pay

## 2022-12-17 ENCOUNTER — Other Ambulatory Visit: Payer: Self-pay

## 2022-12-18 ENCOUNTER — Ambulatory Visit (INDEPENDENT_AMBULATORY_CARE_PROVIDER_SITE_OTHER): Payer: 59 | Admitting: Family Medicine

## 2022-12-18 ENCOUNTER — Encounter: Payer: Self-pay | Admitting: Family Medicine

## 2022-12-18 VITALS — BP 112/72 | HR 77 | Temp 98.4°F | Resp 16 | Ht 64.0 in | Wt 196.2 lb

## 2022-12-18 DIAGNOSIS — Z6832 Body mass index (BMI) 32.0-32.9, adult: Secondary | ICD-10-CM

## 2022-12-18 DIAGNOSIS — L309 Dermatitis, unspecified: Secondary | ICD-10-CM

## 2022-12-18 DIAGNOSIS — F419 Anxiety disorder, unspecified: Secondary | ICD-10-CM | POA: Diagnosis not present

## 2022-12-18 DIAGNOSIS — E559 Vitamin D deficiency, unspecified: Secondary | ICD-10-CM

## 2022-12-18 DIAGNOSIS — E538 Deficiency of other specified B group vitamins: Secondary | ICD-10-CM | POA: Diagnosis not present

## 2022-12-18 DIAGNOSIS — K760 Fatty (change of) liver, not elsewhere classified: Secondary | ICD-10-CM

## 2022-12-18 DIAGNOSIS — E6609 Other obesity due to excess calories: Secondary | ICD-10-CM | POA: Diagnosis not present

## 2022-12-18 NOTE — Progress Notes (Unsigned)
SUBJECTIVE:   Chief Complaint  Patient presents with   Transfer of Care   HPI Patient presents to clinic to transfer care  No acute concerns.  History of asthma Reports exercise induced.  Uses albuterol as needed prior to exercise.  Mood disorder Primarily anxiety.  On Celexa 20 mg daily.  Was prescribed Atarax 25 mg to help with sleep but has not used medication.  Okay with discontinuing.  Tolerating well.  Denies any SI/HI.  Weight loss Prescribed Saxenda and currently on 1.8 mg daily.  Weight has been variable, highest weight 228 pounds.  Now at 196 pounds.  Plans to discontinue medication given insurance no longer covering.  Had stopped taking medication for recent tummy tuck.  OCP for birth control.  No history of VTE/PE, hypertension or migraines with aura.   PERTINENT PMH / PSH: Mood disorder Obesity class I Low vitamin D Hepatic steatosis Eczema  OBJECTIVE:  BP 112/72   Pulse 77   Temp 98.4 F (36.9 C)   Resp 16   Ht  (1.626 m)   Wt 196 lb 4 oz (89 kg)   SpO2 97%   BMI 33.69 kg/m    Physical Exam Vitals reviewed.  Constitutional:      General: She is not in acute distress.    Appearance: She is not ill-appearing.  HENT:     Head: Normocephalic.     Nose: Nose normal.  Eyes:     Conjunctiva/sclera: Conjunctivae normal.  Neck:     Thyroid: No thyromegaly or thyroid tenderness.  Cardiovascular:     Rate and Rhythm: Normal rate and regular rhythm.     Heart sounds: Normal heart sounds.  Pulmonary:     Effort: Pulmonary effort is normal.     Breath sounds: Normal breath sounds.  Abdominal:     General: Abdomen is flat. Bowel sounds are normal.     Palpations: Abdomen is soft.  Musculoskeletal:        General: Normal range of motion.     Cervical back: Normal range of motion.  Neurological:     Mental Status: She is alert and oriented to person, place, and time. Mental status is at baseline.  Psychiatric:        Mood and Affect: Mood  normal.        Behavior: Behavior normal.        Thought Content: Thought content normal.        Judgment: Judgment normal.       12/18/2022    1:19 PM 05/02/2022   10:09 AM 01/30/2022    1:26 PM 03/21/2020   11:07 AM 09/17/2019   10:36 AM  Depression screen PHQ 2/9  Decreased Interest 0 0 0 0 0  Down, Depressed, Hopeless 0 0 0 0 0  PHQ - 2 Score 0 0 0 0 0  Altered sleeping 0      Tired, decreased energy 0      Change in appetite 0      Feeling bad or failure about yourself  0      Trouble concentrating 0      Moving slowly or fidgety/restless 0      Suicidal thoughts 0      PHQ-9 Score 0      Difficult doing work/chores Not difficult at all           12/18/2022    1:19 PM 03/21/2020   11:07 AM  GAD 7 : Generalized Anxiety Score  Nervous, Anxious, on Edge 0 0  Control/stop worrying 0 0  Worry too much - different things 0 0  Trouble relaxing 0 0  Restless 0 0  Easily annoyed or irritable 0 0  Afraid - awful might happen 0 0  Total GAD 7 Score 0 0  Anxiety Difficulty Not difficult at all Not difficult at all      ASSESSMENT/PLAN:  Class 1 obesity due to excess calories without serious comorbidity with body mass index (BMI) of 32.0 to 32.9 in adult Assessment & Plan: Chronic.  Currently on Saxenda 1.8 mg weekly and tolerating well.  Does not plan to continue medication given insurance has stopped coverage. Recently had tummy tuck Encouraged healthy diet and exercise. Check labs   Orders: -     Hemoglobin A1c; Future -     CBC with Differential/Platelet; Future -     Comprehensive metabolic panel; Future -     Lipid panel; Future -     TSH; Future  Anxiety Assessment & Plan: Chronic.  Controlled on current SSRI.  Denies SI/HI. Refill Celexa 20 mg daily.   Discontinue Atarax.  Has not been using. Encouraged CBT Follow-up as needed.  Orders: -     Citalopram Hydrobromide; Take 1 tablet (20 mg total) by mouth every morning.  Dispense: 90 tablet; Refill:  3  Fatty liver Assessment & Plan: Chronic.  Asymptomatic.  Likely secondary to obesity Check LFTs  Orders: -     Lipid panel; Future  Eczema of right hand Assessment & Plan: Chronic.  Intermittent flareups. Refill triamcinolone 0.1% cream  Orders: -     Triamcinolone Acetonide; Apply 1 Application topically 2 (two) times daily. Right hand as needed  Dispense: 454 g; Refill: 0  Vitamin D deficiency Assessment & Plan: Chronic.  Not currently on vitamin D supplements. Check vitamin D levels  Orders: -     VITAMIN D 25 Hydroxy (Vit-D Deficiency, Fractures); Future  B12 deficiency Assessment & Plan: Chronic.  Not on supplements Check vitamin B12 levels  Orders: -     Vitamin B12; Future   PDMP reviewed  Return in about 6 months (around 06/19/2023).  Dana Allan, MD

## 2022-12-18 NOTE — Patient Instructions (Signed)
It was a pleasure meeting you today. Thank you for allowing me to take part in your health care.  Our goals for today as we discussed include:  Schedule lab appointment 1 week prior to annual visit. Fast for 10 hours  Schedule annual in 6 months  Please bring in your Tetanus vaccine date last given   If you have any questions or concerns, please do not hesitate to call the office at [phone].  I look forward to our next visit and until then take care and stay safe.  Regards,   Dana Allan, MD   [facility]

## 2022-12-25 ENCOUNTER — Other Ambulatory Visit: Payer: Self-pay

## 2022-12-25 ENCOUNTER — Encounter: Payer: Self-pay | Admitting: Family Medicine

## 2022-12-25 DIAGNOSIS — F419 Anxiety disorder, unspecified: Secondary | ICD-10-CM | POA: Insufficient documentation

## 2022-12-25 DIAGNOSIS — F39 Unspecified mood [affective] disorder: Secondary | ICD-10-CM | POA: Insufficient documentation

## 2022-12-25 MED ORDER — TRIAMCINOLONE ACETONIDE 0.1 % EX CREA
1.0000 | TOPICAL_CREAM | Freq: Two times a day (BID) | CUTANEOUS | 0 refills | Status: AC
Start: 2022-12-25 — End: ?
  Filled 2022-12-25: qty 454, 90d supply, fill #0

## 2022-12-25 MED ORDER — CITALOPRAM HYDROBROMIDE 20 MG PO TABS
20.0000 mg | ORAL_TABLET | Freq: Every morning | ORAL | 3 refills | Status: DC
  Filled 2022-12-25: qty 90, fill #0
  Filled 2023-01-17: qty 90, 90d supply, fill #0

## 2022-12-25 NOTE — Assessment & Plan Note (Signed)
Chronic.  Currently on Saxenda 1.8 mg weekly and tolerating well.  Does not plan to continue medication given insurance has stopped coverage. Recently had tummy tuck Encouraged healthy diet and exercise. Check labs

## 2022-12-25 NOTE — Assessment & Plan Note (Signed)
Chronic.  Not currently on vitamin D supplements. Check vitamin D levels

## 2022-12-25 NOTE — Assessment & Plan Note (Signed)
Chronic.  Controlled on current SSRI.  Denies SI/HI. Refill Celexa 20 mg daily.   Discontinue Atarax.  Has not been using. Encouraged CBT Follow-up as needed.

## 2022-12-25 NOTE — Assessment & Plan Note (Signed)
Chronic.  Intermittent flareups. Refill triamcinolone 0.1% cream

## 2022-12-25 NOTE — Assessment & Plan Note (Signed)
Chronic.  Not on supplements Check vitamin B12 levels

## 2022-12-25 NOTE — Assessment & Plan Note (Signed)
Chronic.  Asymptomatic.  Likely secondary to obesity Check LFTs

## 2023-01-10 ENCOUNTER — Other Ambulatory Visit: Payer: Self-pay

## 2023-01-17 ENCOUNTER — Other Ambulatory Visit: Payer: Self-pay

## 2023-01-23 ENCOUNTER — Other Ambulatory Visit: Payer: Self-pay | Admitting: Family Medicine

## 2023-01-23 DIAGNOSIS — F419 Anxiety disorder, unspecified: Secondary | ICD-10-CM

## 2023-01-23 MED ORDER — CITALOPRAM HYDROBROMIDE 40 MG PO TABS
40.0000 mg | ORAL_TABLET | Freq: Every morning | ORAL | Status: DC
Start: 2023-01-23 — End: 2023-02-24

## 2023-01-23 NOTE — Progress Notes (Signed)
Patient increased Celexa to 40 mg daily. Recommend she schedule video visit to discuss  Dana Allan, MD

## 2023-02-06 ENCOUNTER — Other Ambulatory Visit: Payer: Self-pay

## 2023-02-06 MED ORDER — TETANUS-DIPHTH-ACELL PERTUSSIS 5-2.5-18.5 LF-MCG/0.5 IM SUSY
0.5000 mL | PREFILLED_SYRINGE | INTRAMUSCULAR | 0 refills | Status: DC
  Filled 2023-02-06: qty 0.5, 1d supply, fill #0

## 2023-02-24 ENCOUNTER — Other Ambulatory Visit: Payer: Self-pay

## 2023-02-24 ENCOUNTER — Telehealth: Payer: 59 | Admitting: Family Medicine

## 2023-02-24 ENCOUNTER — Encounter: Payer: Self-pay | Admitting: Family Medicine

## 2023-02-24 ENCOUNTER — Telehealth (INDEPENDENT_AMBULATORY_CARE_PROVIDER_SITE_OTHER): Payer: 59 | Admitting: Family Medicine

## 2023-02-24 VITALS — Ht 64.0 in | Wt 196.0 lb

## 2023-02-24 DIAGNOSIS — F419 Anxiety disorder, unspecified: Secondary | ICD-10-CM

## 2023-02-24 DIAGNOSIS — F39 Unspecified mood [affective] disorder: Secondary | ICD-10-CM

## 2023-02-24 MED ORDER — CITALOPRAM HYDROBROMIDE 40 MG PO TABS
40.0000 mg | ORAL_TABLET | Freq: Every morning | ORAL | 3 refills | Status: DC
Start: 2023-02-24 — End: 2023-09-09
  Filled 2023-02-24: qty 90, 90d supply, fill #0
  Filled 2023-06-03: qty 90, 90d supply, fill #1

## 2023-02-24 NOTE — Progress Notes (Signed)
Blaine   Error for [facility] trying to get PCP Video Visit- will be calling her PCP office to get that set up.  Patient acknowledged agreement and understanding of the plan.
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
## 2023-03-02 ENCOUNTER — Encounter: Payer: Self-pay | Admitting: Family Medicine

## 2023-03-02 NOTE — Assessment & Plan Note (Addendum)
Chronic. Celexa increased from 20 mg to 40 mg on 05/23 for progression of increased stress at home.  Symptoms now improving.  Denies SI/HI. Refill Celexa to 40 mg  daily.   Encouraged CBT Follow-up as needed.

## 2023-03-02 NOTE — Patient Instructions (Signed)
It was a pleasure meeting you today. Thank you for allowing me to take part in your health care.  Our goals for today as we discussed include:  Continue Celexa 40 mg daily Follow-up as needed.   [facility]  [address]  [place] [address] [phone]    [facility] [place]  [address] [place] [address]  [phone]   For Mental Health Concerns  [facility] [phone] Address: [address] [place], [place] [address] Hours: Open 24/7, No appointment required.    [email or web address] to schedule appointment for therapy if needed.   If you have any questions or concerns, please do not hesitate to call the office at [phone].  I look forward to our next visit and until then take care and stay safe.  Regards,   Dana Allan, MD   [facility]

## 2023-03-04 NOTE — Addendum Note (Signed)
Addended by: Enid Cutter on: 03/04/2023 02:49 PM   Modules accepted: Level of Service

## 2023-03-07 ENCOUNTER — Other Ambulatory Visit: Payer: Self-pay

## 2023-04-14 ENCOUNTER — Other Ambulatory Visit: Payer: Self-pay

## 2023-04-19 ENCOUNTER — Ambulatory Visit
Admission: EM | Admit: 2023-04-19 | Discharge: 2023-04-19 | Disposition: A | Discharge status: Home / Self Care | Payer: 59 | Attending: Emergency Medicine | Admitting: Emergency Medicine

## 2023-04-19 DIAGNOSIS — J392 Other diseases of pharynx: Secondary | ICD-10-CM

## 2023-04-19 MED ORDER — PREDNISONE 20 MG PO TABS: 40.0000 mg | ORAL_TABLET | Freq: Every day | ORAL | 0 refills | Status: DC

## 2023-04-19 MED ORDER — PREDNISONE 20 MG PO TABS
40.0000 mg | ORAL_TABLET | Freq: Every day | ORAL | 0 refills | Status: DC
  Filled 2023-04-19: qty 10, 5d supply, fill #0

## 2023-04-19 MED ORDER — ALBUTEROL SULFATE HFA 108 (90 BASE) MCG/ACT IN AERS: 2.0000 | INHALATION_SPRAY | Freq: Four times a day (QID) | RESPIRATORY_TRACT | 0 refills | Status: AC | PRN

## 2023-04-19 MED ORDER — DEXAMETHASONE SODIUM PHOSPHATE 10 MG/ML IJ SOLN
10.0000 mg | Freq: Once | INTRAMUSCULAR | Status: AC
  Administered 2023-04-19: 10 mg via INTRAMUSCULAR

## 2023-04-19 NOTE — Discharge Instructions (Addendum)
On exam your lungs are clear and you are getting enough air low suspicion that reaction is affecting the lower aspect of your respiratory system therefore we have held off on completing chest x-ray today   Will begin use of steroid course which will reduce inflammatory response which should settle discomfort to the throat and shortness of breath making it easier for you to breathe  You have been given an injection of steroids to open and relax the airway ideally will start to see improvement in 30 minutes to an hour  Starting tomorrow take oral steroids every morning as directed  You have also been prescribed albuterol inhaler which she open and relax the airway, may be used as needed when experiencing shortness of breath or discomfort with breathing ,you do have inhalers at the [facility] pharmacy as well  If your symptoms continue to persist or worsen at any point please follow-up for reevaluation

## 2023-04-19 NOTE — ED Triage Notes (Signed)
Patient presents to [facility] for throat swelling and SOB since yesterday. Today she felt fatigue and is having to take deep breaths in to catch her breath. Treating symptoms with zyrtec. States she has a albuterol but did not use.   Denies fever.

## 2023-04-19 NOTE — ED Provider Notes (Signed)
Renaldo Fiddler    CSN: [id number] Arrival date & time: 04/19/23  1136      History   Chief Complaint Chief Complaint  Patient presents with   Shortness of Breath    HPI Wanda Davis is a 37 y.o. female.   Presents for evaluation of a sensation of throat swelling and tightness beginning 2 days ago after exposure to chemicals at work.  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  Symptoms started within a few hours after exposure.  Has had associated increased fatigue and having difficulty sleeping.  Experiencing some wheezing coming from the throat and requiring more effort to take deep breaths.  Denies shortness of breath, cough, chest pain or tightness, fevers or URI symptoms.  Past Medical History:  Diagnosis Date   History of chicken pox    UTI (urinary tract infection)    Vitamin D deficiency     Patient Active Problem List   Diagnosis Date Noted   Mood disorder (HCC) 12/25/2022   Class 1 obesity due to excess calories without serious comorbidity with body mass index (BMI) of 32.0 to 32.9 in adult 09/13/2022   Fatty liver 03/29/2020   B12 deficiency 03/21/2020   Vitamin D deficiency 09/16/2018   Eczema of right hand 07/02/2018    Past Surgical History:  Procedure Laterality Date   CESAREAN SECTION     C sec 2004 and 2011     OB History   No obstetric history on file.      Home Medications    Prior to Admission medications   Medication Sig Start Date End Date Taking? Authorizing Provider  albuterol (VENTOLIN HFA) 108 (90 Base) MCG/ACT inhaler Inhale 2 puffs into the lungs every 6 (six) hours as needed for wheezing or shortness of breath. 04/19/23  Yes December Hedtke R, NP  predniSONE (DELTASONE) 20 MG tablet Take 2 tablets (40 mg total) by mouth daily. 04/19/23  Yes Briyana Badman R, NP  albuterol (VENTOLIN HFA) 108 (90 Base) MCG/ACT inhaler Inhale 2 puffs into the lungs every 6 (six) hours as needed for  wheezing or shortness of breath. 05/02/22   McLean-Scocuzza, Pasty Spillers, MD  cetirizine (ZYRTEC) 10 MG tablet Take 10 mg by mouth daily.    [provider]  citalopram (CELEXA) 40 MG tablet Take 1 tablet (40 mg total) by mouth every morning. 02/24/23 02/24/24  Dana Allan, MD  fluticasone [facility]) 50 MCG/ACT nasal spray Place 2 sprays into both nostrils daily. 08/05/21   Junie Spencer, FNP  Norgestimate-Ethinyl Estradiol Triphasic (TRI-SPRINTEC) 0.18/0.215/0.25 MG-35 MCG tablet Take 1 tablet by mouth daily. 05/02/22 07/11/23  McLean-Scocuzza, Pasty Spillers, MD  triamcinolone cream (KENALOG) 0.1 % Apply 1 Application topically 2 (two) times daily. Right hand as needed 12/25/22   Dana Allan, MD    Family History Family History  Problem Relation Age of Onset   Diabetes Maternal Grandmother    Hypertension Maternal Grandmother    Heart disease Maternal Grandfather    Cancer Paternal Grandfather        lung smoker     Social History Social History   Tobacco Use   Smoking status: Never   Smokeless tobacco: Never  Substance Use Topics   Alcohol use: Yes   Drug use: Not Currently     Allergies   Patient has no known allergies.   Review of Systems Review of Systems  Constitutional: Negative.   HENT: Negative.    Respiratory:  Positive  for wheezing. Negative for apnea, cough, choking, chest tightness, shortness of breath and stridor.      Physical Exam Triage Vital Signs ED Triage Vitals  Encounter Vitals Group     BP 04/19/23 1220 115/72     Systolic BP Percentile --      Diastolic BP Percentile --      Pulse Rate 04/19/23 1220 90     Resp 04/19/23 1220 16     Temp 04/19/23 1220 97.8 F (36.6 C)     Temp Source 04/19/23 1220 Temporal     SpO2 04/19/23 1220 96 %     Weight --      Height --      Head Circumference --      Peak Flow --      Pain Score 04/19/23 1208 0     Pain Loc --      Pain Education --      Exclude from Growth Chart --    No data  found.  Updated Vital Signs BP 115/72 (BP Location: Left Arm)   Pulse 90   Temp 97.8 F (36.6 C) (Temporal)   Resp 16   LMP 04/05/2023 (Approximate)   SpO2 96%   Visual Acuity Right Eye Distance:   Left Eye Distance:   Bilateral Distance:    Right Eye Near:   Left Eye Near:    Bilateral Near:     Physical Exam Constitutional:      Appearance: Normal appearance.  HENT:     Head: Normocephalic.     Mouth/Throat:     Pharynx: Oropharynx is clear. No pharyngeal swelling, oropharyngeal exudate or posterior oropharyngeal erythema.     Tonsils: No tonsillar exudate. 1+ on the right. 1+ on the left.  Eyes:     Extraocular Movements: Extraocular movements intact.  Cardiovascular:     Rate and Rhythm: Normal rate and regular rhythm.     Pulses: Normal pulses.     Heart sounds: Normal heart sounds.  Pulmonary:     Effort: Pulmonary effort is normal.     Breath sounds: Normal breath sounds.  Neurological:     Mental Status: She is alert.      UC Treatments / Results  Labs (all labs ordered are listed, but only abnormal results are displayed) Labs Reviewed - No data to display  EKG   Radiology No results found.  Procedures Procedures (including critical care time)  Medications Ordered in UC Medications  dexamethasone (DECADRON) injection 10 mg (10 mg Intramuscular Given 04/19/23 1240)    Initial Impression / Assessment and Plan / UC Course  I have reviewed the triage vital signs and the nursing notes.  Pertinent labs & imaging results that were available during my care of the patient were reviewed by me and considered in my medical decision making (see chart for details).  Throat irritation  Mild tonsillar adenopathy without erythema, exudate, pharynx is clear without obstruction, lungs are clear to auscultation and O2 saturation greater than 90%, will move forward with treatment of irritation, stable for outpatient management at this time, mild reaction,  Decadron injection given prior to discharge and prednisone burst prescribed for outpatient use as well as albuterol inhaler advised to rest for management of fatigue and advised follow-up if symptoms persist or worsen Final Clinical Impressions(s) / UC Diagnoses   Final diagnoses:  Throat irritation     Discharge Instructions      On exam your lungs are clear and you are getting  enough air low suspicion that reaction is affecting the lower aspect of your respiratory system therefore we have held off on completing chest x-ray today   Will begin use of steroid course which will reduce inflammatory response which should settle discomfort to the throat and shortness of breath making it easier for you to breathe  You have been given an injection of steroids to open and relax the airway ideally will start to see improvement in 30 minutes to an hour  Starting tomorrow take oral steroids every morning as directed  You have also been prescribed albuterol inhaler which she open and relax the airway, may be used as needed when experiencing shortness of breath or discomfort with breathing ,you do have inhalers at the [facility] pharmacy as well  If your symptoms continue to persist or worsen at any point please follow-up for reevaluation   ED Prescriptions     Medication Sig Dispense Auth. Provider   predniSONE (DELTASONE) 20 MG tablet  (Status: Discontinued) Take 2 tablets (40 mg total) by mouth daily. 10 tablet Rhyan Wolters R, NP   predniSONE (DELTASONE) 20 MG tablet Take 2 tablets (40 mg total) by mouth daily. 10 tablet Edyn Qazi R, NP   albuterol (VENTOLIN HFA) 108 (90 Base) MCG/ACT inhaler Inhale 2 puffs into the lungs every 6 (six) hours as needed for wheezing or shortness of breath. 8 g Valinda Hoar, NP      PDMP not reviewed this encounter.   Valinda Hoar, NP 04/19/23 1251

## 2023-04-20 ENCOUNTER — Other Ambulatory Visit: Payer: Self-pay

## 2023-06-03 ENCOUNTER — Other Ambulatory Visit: Payer: Self-pay

## 2023-06-09 ENCOUNTER — Other Ambulatory Visit: Payer: Self-pay | Admitting: Family Medicine

## 2023-06-09 ENCOUNTER — Other Ambulatory Visit: Payer: Self-pay

## 2023-06-11 MED ORDER — NORGESTIM-ETH ESTRAD TRIPHASIC 0.18/0.215/0.25 MG-35 MCG PO TABS
1.0000 | ORAL_TABLET | Freq: Every day | ORAL | 3 refills | Status: DC
  Filled 2023-06-11 – 2023-06-30 (×2): qty 84, 84d supply, fill #0

## 2023-06-12 ENCOUNTER — Other Ambulatory Visit: Payer: Self-pay

## 2023-06-25 ENCOUNTER — Other Ambulatory Visit (INDEPENDENT_AMBULATORY_CARE_PROVIDER_SITE_OTHER): Payer: 59

## 2023-06-25 DIAGNOSIS — E6609 Other obesity due to excess calories: Secondary | ICD-10-CM

## 2023-06-25 DIAGNOSIS — E538 Deficiency of other specified B group vitamins: Secondary | ICD-10-CM | POA: Diagnosis not present

## 2023-06-25 DIAGNOSIS — Z6832 Body mass index (BMI) 32.0-32.9, adult: Secondary | ICD-10-CM | POA: Diagnosis not present

## 2023-06-25 DIAGNOSIS — K760 Fatty (change of) liver, not elsewhere classified: Secondary | ICD-10-CM

## 2023-06-25 DIAGNOSIS — E66811 Obesity, class 1: Secondary | ICD-10-CM

## 2023-06-25 DIAGNOSIS — E559 Vitamin D deficiency, unspecified: Secondary | ICD-10-CM

## 2023-06-25 LAB — COMPREHENSIVE METABOLIC PANEL
ALT: 11 U/L (ref 0–35)
AST: 11 U/L (ref 0–37)
Albumin: 3.5 g/dL (ref 3.5–5.2)
Alkaline Phosphatase: 71 U/L (ref 39–117)
BUN: 8 mg/dL (ref 6–23)
CO2: 27 meq/L (ref 19–32)
Calcium: 8.6 mg/dL (ref 8.4–10.5)
Chloride: 104 meq/L (ref 96–112)
Creatinine, Ser: 0.57 mg/dL (ref 0.40–1.20)
GFR: 116.44 mL/min (ref >60.00)
Glucose, Bld: 99 mg/dL (ref 70–99)
Potassium: 3.9 meq/L (ref 3.5–5.1)
Sodium: 138 meq/L (ref 135–145)
Total Bilirubin: 0.3 mg/dL (ref 0.2–1.2)
Total Protein: 6.5 g/dL (ref 6.0–8.3)

## 2023-06-25 LAB — CBC WITH DIFFERENTIAL/PLATELET
Basophils Absolute: 0.1 10*3/uL (ref 0.0–0.1)
Basophils Relative: 1 % (ref 0.0–3.0)
Eosinophils Absolute: 0.4 10*3/uL (ref 0.0–0.7)
Eosinophils Relative: 4 % (ref 0.0–5.0)
HCT: 39.4 % (ref 36.0–46.0)
Hemoglobin: 12.9 g/dL (ref 12.0–15.0)
Lymphocytes Relative: 35 % (ref 12.0–46.0)
Lymphs Abs: 3.6 10*3/uL (ref 0.7–4.0)
MCHC: 32.9 g/dL (ref 30.0–36.0)
MCV: 87.8 fL (ref 78.0–100.0)
Monocytes Absolute: 0.7 10*3/uL (ref 0.1–1.0)
Monocytes Relative: 6.6 % (ref 3.0–12.0)
Neutro Abs: 5.4 10*3/uL (ref 1.4–7.7)
Neutrophils Relative %: 53.4 % (ref 43.0–77.0)
Platelets: 331 10*3/uL (ref 150.0–400.0)
RBC: 4.48 Mil/uL (ref 3.87–5.11)
RDW: 12.8 % (ref 11.5–15.5)
WBC: 10.2 10*3/uL (ref 4.0–10.5)

## 2023-06-25 LAB — HEMOGLOBIN A1C: Hgb A1c MFr Bld: 5.9 % (ref 4.6–6.5)

## 2023-06-25 LAB — LIPID PANEL
Cholesterol: 140 mg/dL (ref 0–200)
HDL: 54 mg/dL (ref >39.00)
LDL Cholesterol: 61 mg/dL (ref 0–99)
NonHDL: 85.94
Total CHOL/HDL Ratio: 3
Triglycerides: 125 mg/dL (ref 0.0–149.0)
VLDL: 25 mg/dL (ref 0.0–40.0)

## 2023-06-25 LAB — TSH: TSH: 2.1 u[IU]/mL (ref 0.35–5.50)

## 2023-06-25 LAB — VITAMIN B12: Vitamin B-12: 168 pg/mL — ABNORMAL LOW (ref 211–911)

## 2023-06-25 LAB — VITAMIN D 25 HYDROXY (VIT D DEFICIENCY, FRACTURES): VITD: 21.62 ng/mL — ABNORMAL LOW (ref 30.00–100.00)

## 2023-06-26 ENCOUNTER — Other Ambulatory Visit: Payer: Self-pay | Admitting: Family Medicine

## 2023-06-26 ENCOUNTER — Other Ambulatory Visit: Payer: Self-pay

## 2023-06-26 ENCOUNTER — Encounter: Payer: Self-pay | Admitting: Family Medicine

## 2023-06-26 DIAGNOSIS — E559 Vitamin D deficiency, unspecified: Secondary | ICD-10-CM

## 2023-06-26 DIAGNOSIS — E538 Deficiency of other specified B group vitamins: Secondary | ICD-10-CM

## 2023-06-26 MED ORDER — VITAMIN D (ERGOCALCIFEROL) 1.25 MG (50000 UNIT) PO CAPS
50000.0000 [IU] | ORAL_CAPSULE | ORAL | 1 refills | Status: DC
  Filled 2023-06-26: qty 12, 84d supply, fill #0

## 2023-06-26 MED ORDER — VITAMIN B-12 1000 MCG PO TABS
1000.0000 ug | ORAL_TABLET | Freq: Every day | ORAL | 3 refills | Status: DC
  Filled 2023-06-26: qty 90, 90d supply, fill #0

## 2023-06-30 ENCOUNTER — Other Ambulatory Visit: Payer: Self-pay

## 2023-08-19 ENCOUNTER — Encounter: Payer: 59 | Admitting: Family Medicine

## 2023-09-09 ENCOUNTER — Encounter: Payer: Self-pay | Admitting: Family Medicine

## 2023-09-09 ENCOUNTER — Telehealth: Payer: Self-pay

## 2023-09-09 ENCOUNTER — Other Ambulatory Visit: Payer: Self-pay

## 2023-09-09 ENCOUNTER — Other Ambulatory Visit: Payer: Self-pay | Admitting: Family Medicine

## 2023-09-09 DIAGNOSIS — F39 Unspecified mood [affective] disorder: Secondary | ICD-10-CM

## 2023-09-09 MED ORDER — CITALOPRAM HYDROBROMIDE 40 MG PO TABS
40.0000 mg | ORAL_TABLET | Freq: Every morning | ORAL | 3 refills | Status: DC
  Filled 2023-09-09 – 2023-09-12 (×2): qty 90, 90d supply, fill #0
  Filled 2023-12-12: qty 90, 90d supply, fill #1
  Filled 2024-03-15: qty 90, 90d supply, fill #2
  Filled 2024-06-03: qty 90, 90d supply, fill #3

## 2023-09-09 MED ORDER — NORGESTIM-ETH ESTRAD TRIPHASIC 0.18/0.215/0.25 MG-35 MCG PO TABS
1.0000 | ORAL_TABLET | Freq: Every day | ORAL | 3 refills | Status: DC
  Filled 2023-09-09 – 2023-09-12 (×2): qty 84, 84d supply, fill #0
  Filled 2023-12-12: qty 84, 84d supply, fill #1
  Filled 2024-03-15: qty 84, 84d supply, fill #2
  Filled 2024-06-03: qty 84, 84d supply, fill #3

## 2023-09-09 NOTE — Telephone Encounter (Signed)
 I left a voicemail for patient and sent a letter to her via MyChart asking her to please contact us to reschedule her 09/10/2023 appointment with Dr. Kenney Houseman

## 2023-09-09 NOTE — Telephone Encounter (Signed)
 Copied from CRM [id number]. Topic: Clinical - Medication Refill >> Sep 09, 2023  2:29 PM Macario HERO wrote: Most Recent Primary Care Visit:  Provider: [facility]  Department: [facility]  Visit Type: LAB  Date: 06/25/2023   Medication: citalopram  (CELEXA ) 40 MG tablet [[id number]] AND Norgestimate -Ethinyl Estradiol Triphasic (TRI-VYLIBRA ) 0.18/0.215/0.25 MG-35 MCG tablet [[id number]]  Has the patient contacted their pharmacy? No (Agent: If no, request that the patient contact the pharmacy for the refill. If patient does not wish to contact the pharmacy document the reason why and proceed with request.) (Agent: If yes, when and what did the pharmacy advise?)  Is this the correct pharmacy for this prescription? Yes If no, delete pharmacy and type the correct one.  This is the patient's preferred pharmacy:  [facility] [address] [place] [address] Phone: [phone] Fax: [phone]     Has the prescription been filled recently? Yes  Is the patient out of the medication? Yes  Has the patient been seen for an appointment in the last year OR does the patient have an upcoming appointment? Yes  Can we respond through MyChart? Yes  Agent: Please be advised that Rx refills may take up to 3 business days. We ask that you follow-up with your pharmacy.

## 2023-09-09 NOTE — Telephone Encounter (Signed)
 Copied from CRM [id number]. Topic: Clinical - Medication Refill >> Sep 09, 2023  2:29 PM Wanda Davis wrote: Most Recent Primary Care Visit:  Provider: [facility]  Department: [facility]  Visit Type: LAB  Date: 06/25/2023   Medication: citalopram  (CELEXA ) 40 MG tablet [[id number]] AND Norgestimate -Ethinyl Estradiol Triphasic (TRI-VYLIBRA ) 0.18/0.215/0.25 MG-35 MCG tablet [[id number]]  Has the patient contacted their pharmacy? No (Agent: If no, request that the patient contact the pharmacy for the refill. If patient does not wish to contact the pharmacy document the reason why and proceed with request.) (Agent: If yes, when and what did the pharmacy advise?)  Is this the correct pharmacy for this prescription? Yes If no, delete pharmacy and type the correct one.  This is the patient's preferred pharmacy:  [facility] [address] [place] [address] Phone: [phone] Fax: [phone]     Has the prescription been filled recently? Yes  Is the patient out of the medication? Yes  Has the patient been seen for an appointment in the last year OR does the patient have an upcoming appointment? Yes  Can we respond through MyChart? Yes  Agent: Please be advised that Rx refills may take up to 3 business days. We ask that you follow-up with your pharmacy.

## 2023-09-10 ENCOUNTER — Encounter: Payer: 59 | Admitting: Family Medicine

## 2023-09-12 ENCOUNTER — Other Ambulatory Visit: Payer: Self-pay

## 2023-10-01 ENCOUNTER — Encounter: Payer: 59 | Admitting: Family Medicine

## 2023-10-29 ENCOUNTER — Other Ambulatory Visit: Payer: Self-pay

## 2023-10-29 ENCOUNTER — Ambulatory Visit (INDEPENDENT_AMBULATORY_CARE_PROVIDER_SITE_OTHER): Payer: 59 | Admitting: Family Medicine

## 2023-10-29 ENCOUNTER — Encounter: Payer: Self-pay | Admitting: Family Medicine

## 2023-10-29 VITALS — BP 108/66 | HR 72 | Temp 98.4°F | Resp 18 | Ht 63.5 in | Wt 219.1 lb

## 2023-10-29 DIAGNOSIS — E538 Deficiency of other specified B group vitamins: Secondary | ICD-10-CM | POA: Diagnosis not present

## 2023-10-29 DIAGNOSIS — E559 Vitamin D deficiency, unspecified: Secondary | ICD-10-CM | POA: Diagnosis not present

## 2023-10-29 DIAGNOSIS — Z0000 Encounter for general adult medical examination without abnormal findings: Secondary | ICD-10-CM | POA: Diagnosis not present

## 2023-10-29 DIAGNOSIS — R7303 Prediabetes: Secondary | ICD-10-CM

## 2023-10-29 DIAGNOSIS — F39 Unspecified mood [affective] disorder: Secondary | ICD-10-CM

## 2023-10-29 MED ORDER — VITAMIN B-12 1000 MCG PO TABS
1000.0000 ug | ORAL_TABLET | Freq: Every day | ORAL | 3 refills | Status: AC
Start: 2023-10-29 — End: ?
  Filled 2023-10-29 – 2024-03-15 (×2): qty 90, 90d supply, fill #0
  Filled 2024-06-03 (×2): qty 90, 90d supply, fill #1

## 2023-10-29 MED ORDER — VITAMIN D (ERGOCALCIFEROL) 1.25 MG (50000 UNIT) PO CAPS
50000.0000 [IU] | ORAL_CAPSULE | ORAL | 1 refills | Status: AC
  Filled 2023-10-29 – 2023-12-12 (×2): qty 12, 84d supply, fill #0
  Filled 2024-03-15: qty 12, 84d supply, fill #1

## 2023-10-29 NOTE — Progress Notes (Signed)
 SUBJECTIVE:   Chief Complaint  Patient presents with   Annual Exam   HPI Presents to clinic for annual visit  Discussed the use of AI scribe software for clinical note transcription with the patient, who gave verbal consent to proceed.  History of Present Illness Wanda Davis is a 38 year old female who presents for an annual physical exam.  She has completed most of her screening tests, with her Pap smear due in October 2025. Her last Pap smear was in 2022. Recent labs from October 2024 showed low vitamin D levels, for which she completed a six-month course of supplementation but did not receive refills. Her vitamin D levels were not extremely low. She also has chronic low vitamin B12 levels and was advised to take sublingual vitamin B12 daily, though she has not been taking it regularly. Her cholesterol levels, kidney function, liver function, and white blood cell count were all within normal limits. However, her A1c was noted to be 5.9, indicating a need for monitoring.  She is currently taking Celexa, which was increased to 40 mg, and she is doing better on this dose. She also takes birth control pills and requires refills for both medications.  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 She describes her schedule as stressful but manageable.  She had a tummy tuck about a year and a half ago and reports some discomfort during certain physical activities, such as exercise and sexual intercourse, which she attributes to the surgery. She has been taking it slow with her exercises and uses protein drinks to manage hunger.  No chest pain, shortness of breath, or swelling in her legs. Her appetite is okay, and she notes that since stopping SGLT2 shots, she experiences 'food noise' or increased hunger.      PERTINENT PMH / PSH: As above  OBJECTIVE:  BP 108/66    Pulse 72   Temp 98.4 F (36.9 C)   Resp 18   Ht 5' 3.5" (1.613 m)   Wt 219 lb 2 oz (99.4 kg)   LMP 10/21/2023   SpO2 97%   BMI 38.21 kg/m    Physical Exam Vitals reviewed.  Constitutional:      General: She is not in acute distress.    Appearance: She is obese. She is not ill-appearing.  HENT:     Head: Normocephalic.     Right Ear: Tympanic membrane, ear canal and external ear normal.     Left Ear: Tympanic membrane, ear canal and external ear normal.     Nose: Nose normal.     Mouth/Throat:     Mouth: Mucous membranes are moist.  Eyes:     Extraocular Movements: Extraocular movements intact.     Conjunctiva/sclera: Conjunctivae normal.     Pupils: Pupils are equal, round, and reactive to light.  Neck:     Thyroid: No thyromegaly or thyroid tenderness.     Vascular: No carotid bruit.  Cardiovascular:     Rate and Rhythm: Normal rate and regular rhythm.     Pulses: Normal pulses.     Heart sounds: Normal heart sounds.  Pulmonary:     Effort: Pulmonary effort is normal.     Breath sounds: Normal breath sounds.  Abdominal:     General: Bowel sounds are normal. There is no distension.     Palpations: Abdomen is soft.  Tenderness: There is no abdominal tenderness. There is no right CVA tenderness, left CVA tenderness, guarding or rebound.  Musculoskeletal:        General: Normal range of motion.     Cervical back: Normal range of motion.     Right lower leg: No edema.     Left lower leg: No edema.  Lymphadenopathy:     Cervical: No cervical adenopathy.  Skin:    Capillary Refill: Capillary refill takes less than 2 seconds.  Neurological:     General: No focal deficit present.     Mental Status: She is alert and oriented to person, place, and time. Mental status is at baseline.     Motor: No weakness.  Psychiatric:        Mood and Affect: Mood normal.        Behavior: Behavior normal.        Thought Content: Thought content normal.        Judgment: Judgment  normal.           10/29/2023   11:25 AM 12/18/2022    1:19 PM 05/02/2022   10:09 AM 01/30/2022    1:26 PM 03/21/2020   11:07 AM  Depression screen PHQ 2/9  Decreased Interest 1 0 0 0 0  Down, Depressed, Hopeless 0 0 0 0 0  PHQ - 2 Score 1 0 0 0 0  Altered sleeping 1 0     Tired, decreased energy 1 0     Change in appetite 0 0     Feeling bad or failure about yourself  0 0     Trouble concentrating 0 0     Moving slowly or fidgety/restless 0 0     Suicidal thoughts 0 0     PHQ-9 Score 3 0     Difficult doing work/chores Somewhat difficult Not difficult at all         10/29/2023   11:25 AM 12/18/2022    1:19 PM 03/21/2020   11:07 AM  GAD 7 : Generalized Anxiety Score  Nervous, Anxious, on Edge 0 0 0  Control/stop worrying 0 0 0  Worry too much - different things 1 0 0  Trouble relaxing 0 0 0  Restless 0 0 0  Easily annoyed or irritable 0 0 0  Afraid - awful might happen 0 0 0  Total GAD 7 Score 1 0 0  Anxiety Difficulty Somewhat difficult Not difficult at all Not difficult at all    ASSESSMENT/PLAN:  Annual physical exam Assessment & Plan: -Pap smear due in October 2025. Advise patient to schedule appointment. -Continue Celexa and birth control as currently prescribed.  -Advise patient to perform regular self breast exams. -Mammogram screening to start at age 19. -Recommend regular self breast exams -Colorectal cancer screening with Cologuard to start between ages 45-50.   Mood disorder [facility]) Assessment & Plan: Doing well on current SSRI.   Denies SI/HI. Continue Celexa to 40 mg  daily.   Encouraged CBT Follow-up as needed.   B12 deficiency Assessment & Plan: Low levels noted on recent labs. Patient has been taking daily oral supplementation inconsistently. -Advise patient to take daily oral Vitamin B12 supplementation consistently. -Suggest considering sublingual B12 supplementation or monthly B12 injections. -Plan to recheck B12 levels in the  future.  Orders: -     Vitamin B-12; Take 1 tablet (1,000 mcg total) by mouth daily.  Dispense: 90 tablet; Refill: 3  Vitamin D deficiency Assessment & Plan: Low levels  noted on recent labs. Patient completed a 28-month course of weekly Vitamin D supplementation but did not refill the prescription. -Advise patient to refill and complete another 68-month course of weekly Vitamin D supplementation.  Orders: -     Vitamin D (Ergocalciferol); Take 1 capsule (50,000 Units total) by mouth every 7 (seven) days.  Dispense: 12 capsule; Refill: 1  Prediabetes Assessment & Plan: Recent A1C of 5.9. -Continue monitoring.     PDMP reviewed  Return if symptoms worsen or fail to improve, for PCP.  Dana Allan, MD

## 2023-10-29 NOTE — Patient Instructions (Addendum)
 It was a pleasure meeting you today. Thank you for allowing me to take part in your health care.  Our goals for today as we discussed include:  Continue Vitamin D 800IU daily. Over the counter.  Continue Vitamin Vitamin B 12 dissolvable tablets 1000 mcg daily .  Over the counter.    Schedule PAP for 06/2024   Refills have been sent for requested medications   This is a list of the screening recommended for you and due dates:  Health Maintenance  Topic Date Due   Pneumococcal Vaccination (1 of 2 - PCV) Never done   COVID-19 Vaccine (4 - 2024-25 season) 05/04/2023   Pap with HPV screening  06/13/2024   DTaP/Tdap/Td vaccine (2 - Td or Tdap) 02/05/2033   Flu Shot  Completed   Hepatitis C Screening  Completed   HIV Screening  Completed   HPV Vaccine  Aged Out     If you have any questions or concerns, please do not hesitate to call the office at [phone].  I look forward to our next visit and until then take care and stay safe.  Regards,   Dana Allan, MD   [facility]

## 2023-11-03 ENCOUNTER — Encounter: Payer: Self-pay | Admitting: Family Medicine

## 2023-11-03 DIAGNOSIS — R7303 Prediabetes: Secondary | ICD-10-CM | POA: Insufficient documentation

## 2023-11-03 NOTE — Assessment & Plan Note (Signed)
-  Pap smear due in October 2025. Advise patient to schedule appointment. -Continue Celexa and birth control as currently prescribed.  -Advise patient to perform regular self breast exams. -Mammogram screening to start at age 38. -Recommend regular self breast exams -Colorectal cancer screening with Cologuard to start between ages 9-50.

## 2023-11-03 NOTE — Assessment & Plan Note (Signed)
 Doing well on current SSRI.   Denies SI/HI. Continue Celexa to 40 mg  daily.   Encouraged CBT Follow-up as needed.

## 2023-11-03 NOTE — Assessment & Plan Note (Signed)
 Recent A1C of 5.9. -Continue monitoring.

## 2023-11-03 NOTE — Assessment & Plan Note (Signed)
 Low levels noted on recent labs. Patient completed a 45-month course of weekly Vitamin D supplementation but did not refill the prescription. -Advise patient to refill and complete another 77-month course of weekly Vitamin D supplementation.

## 2023-11-03 NOTE — Assessment & Plan Note (Signed)
 Low levels noted on recent labs. Patient has been taking daily oral supplementation inconsistently. -Advise patient to take daily oral Vitamin B12 supplementation consistently. -Suggest considering sublingual B12 supplementation or monthly B12 injections. -Plan to recheck B12 levels in the future.

## 2023-11-11 ENCOUNTER — Other Ambulatory Visit: Payer: Self-pay

## 2023-12-12 ENCOUNTER — Other Ambulatory Visit: Payer: Self-pay

## 2024-02-03 ENCOUNTER — Other Ambulatory Visit: Payer: Self-pay

## 2024-02-21 ENCOUNTER — Encounter: Payer: Self-pay | Admitting: Emergency Medicine

## 2024-02-21 ENCOUNTER — Ambulatory Visit
Admission: EM | Admit: 2024-02-21 | Discharge: 2024-02-21 | Disposition: A | Discharge status: Home / Self Care | Attending: Emergency Medicine | Admitting: Emergency Medicine

## 2024-02-21 DIAGNOSIS — J069 Acute upper respiratory infection, unspecified: Secondary | ICD-10-CM

## 2024-02-21 MED ORDER — PREDNISONE 10 MG (21) PO TBPK: ORAL_TABLET | Freq: Every day | ORAL | 0 refills | Status: DC

## 2024-02-21 MED ORDER — AZITHROMYCIN 250 MG PO TABS: 250.0000 mg | ORAL_TABLET | Freq: Every day | ORAL | 0 refills | Status: DC

## 2024-02-21 NOTE — ED Provider Notes (Signed)
 Wanda Davis    CSN: [id number] Arrival date & time: 02/21/24  1247      History   Chief Complaint Chief Complaint  Patient presents with   Sore Throat   Cough    HPI Wanda Davis is a 38 y.o. female.   Patient presents for evaluation of a productive cough with green sputum.  Associated sore throat and mucus within the throat.  Endorses symptoms progressively worsening, present for 9 days.  No known sick contacts but works as a Engineer, civil (consulting).  No denies shortness of breath, wheezing or fever.   Past Medical History:  Diagnosis Date   History of chicken pox    UTI (urinary tract infection)    Vitamin D  deficiency     Patient Active Problem List   Diagnosis Date Noted   Prediabetes 11/03/2023   Mood disorder (HCC) 12/25/2022   Class 1 obesity due to excess calories without serious comorbidity with body mass index (BMI) of 32.0 to 32.9 in adult 09/13/2022   Fatty liver 03/29/2020   B12 deficiency 03/21/2020   Vitamin D  deficiency 09/16/2018   Annual physical exam 09/16/2018   Eczema of right hand 07/02/2018    Past Surgical History:  Procedure Laterality Date   CESAREAN SECTION     C sec 2004 and 2011     OB History   No obstetric history on file.      Home Medications    Prior to Admission medications   Medication Sig Start Date End Date Taking? Authorizing Provider  azithromycin  (ZITHROMAX ) 250 MG tablet Take 1 tablet (250 mg total) by mouth daily. Take first 2 tablets together, then 1 every day until finished. 02/21/24  Yes Cadyn Fann R, NP  predniSONE  (STERAPRED UNI-PAK 21 TAB) 10 MG (21) TBPK tablet Take by mouth daily. Take 6 tabs by mouth daily  for 1 days, then 5 tabs for 1 days, then 4 tabs for 1 days, then 3 tabs for 1 days, 2 tabs for 1 days, then 1 tab by mouth daily for 1 days 02/21/24  Yes Clayburn Weekly R, NP  albuterol  (VENTOLIN  HFA) 108 (90 Base) MCG/ACT inhaler Inhale 2 puffs into the lungs every 6 (six) hours as needed for  wheezing or shortness of breath. 05/02/22   McLean-Scocuzza, Randine SAILOR, MD  albuterol  (VENTOLIN  HFA) 108 (90 Base) MCG/ACT inhaler Inhale 2 puffs into the lungs every 6 (six) hours as needed for wheezing or shortness of breath. 04/19/23   Geneieve Duell, Shelba SAUNDERS, NP  cetirizine  (ZYRTEC ) 10 MG tablet Take 10 mg by mouth daily.    [provider]  citalopram  (CELEXA ) 40 MG tablet Take 1 tablet (40 mg total) by mouth every morning. 09/09/23 09/08/24  Hope Merle, MD  cyanocobalamin  (VITAMIN B12) 1000 MCG tablet Take 1 tablet (1,000 mcg total) by mouth daily. 10/29/23   Hope Merle, MD  fluticasone  (FLONASE ) 50 MCG/ACT nasal spray Place 2 sprays into both nostrils daily. 08/05/21   Lavell Bari LABOR, FNP  Norgestimate -Ethinyl Estradiol Triphasic (TRI-VYLIBRA ) 0.18/0.215/0.25 MG-35 MCG tablet Take 1 tablet by mouth daily. 09/09/23 09/08/24  Hope Merle, MD  triamcinolone  cream (KENALOG ) 0.1 % Apply 1 Application topically 2 (two) times daily. Right hand as needed 12/25/22   Hope Merle, MD  Vitamin D , Ergocalciferol , (DRISDOL ) 1.25 MG (50000 UNIT) CAPS capsule Take 1 capsule (50,000 Units total) by mouth every 7 (seven) days. 10/29/23   Hope Merle, MD    Family History Family History  Problem Relation Age of Onset  Diabetes Maternal Grandmother    Hypertension Maternal Grandmother    Heart disease Maternal Grandfather    Cancer Paternal Grandfather        lung smoker     Social History Social History   Tobacco Use   Smoking status: Never   Smokeless tobacco: Never  Substance Use Topics   Alcohol use: Yes   Drug use: Not Currently     Allergies   Patient has no known allergies.   Review of Systems Review of Systems   Physical Exam Triage Vital Signs ED Triage Vitals  Encounter Vitals Group     BP 02/21/24 1258 113/74     Girls Systolic BP Percentile --      Girls Diastolic BP Percentile --      Boys Systolic BP Percentile --      Boys Diastolic BP Percentile --      Pulse Rate  02/21/24 1258 (!) 101     Resp 02/21/24 1258 18     Temp 02/21/24 1258 99 F (37.2 C)     Temp Source 02/21/24 1258 Oral     SpO2 02/21/24 1258 98 %     Weight --      Height --      Head Circumference --      Peak Flow --      Pain Score 02/21/24 1303 5     Pain Loc --      Pain Education --      Exclude from Growth Chart --    No data found.  Updated Vital Signs BP 113/74 (BP Location: Left Arm)   Pulse (!) 101   Temp 99 F (37.2 C) (Oral)   Resp 18   LMP 02/03/2024 (Approximate)   SpO2 98%   Visual Acuity Right Eye Distance:   Left Eye Distance:   Bilateral Distance:    Right Eye Near:   Left Eye Near:    Bilateral Near:     Physical Exam Constitutional:      Appearance: She is well-developed.  HENT:     Head: Normocephalic.     Right Ear: Tympanic membrane and ear canal normal.     Left Ear: Tympanic membrane and ear canal normal.     Nose: Congestion present.     Mouth/Throat:     Pharynx: Posterior oropharyngeal erythema present. No oropharyngeal exudate.  Pulmonary:     Effort: Pulmonary effort is normal.   Neurological:     Mental Status: She is alert and oriented to person, place, and time. Mental status is at baseline.      UC Treatments / Results  Labs (all labs ordered are listed, but only abnormal results are displayed) Labs Reviewed - No data to display  EKG   Radiology No results found.  Procedures Procedures (including critical care time)  Medications Ordered in UC Medications - No data to display  Initial Impression / Assessment and Plan / UC Course  I have reviewed the triage vital signs and the nursing notes.  Pertinent labs & imaging results that were available during my care of the patient were reviewed by me and considered in my medical decision making (see chart for details).  Acute URI  Patient is in no signs of distress nor toxic appearing.  Vital signs are stable.  Low suspicion for pneumonia, pneumothorax or  bronchitis and therefore will defer imaging.  Declined strep testing.  Symptoms present for greater than 7 days, progressively worsening per patient, placed  on azithromycin  and prednisone .May use additional over-the-counter medications as needed for supportive care.  May follow-up with urgent care as needed if symptoms persist or worsen.  Note given.   Final Clinical Impressions(s) / UC Diagnoses   Final diagnoses:  Acute URI     Discharge Instructions      Take azithromycin  as directed for bacterial coverage  Begin prednisone  every morning with food to reduce swelling throughout the body and help with pain  You can take Tylenol and/or Ibuprofen as needed for fever reduction and pain relief.   For cough: honey 1/2 to 1 teaspoon (you can dilute the honey in water or another fluid).  You can also use guaifenesin and dextromethorphan for cough. You can use a humidifier for chest congestion and cough.  If you don't have a humidifier, you can sit in the bathroom with the hot shower running.      For sore throat: try warm salt water gargles, cepacol lozenges, throat spray, warm tea or water with lemon/honey, popsicles or ice, or OTC cold relief medicine for throat discomfort.   For congestion: take a daily anti-histamine like Zyrtec , Claritin, and a oral decongestant, such as pseudoephedrine.  You can also use Flonase  1-2 sprays in each nostril daily.   It is important to stay hydrated: drink plenty of fluids (water, gatorade/powerade/pedialyte, juices, or teas) to keep your throat moisturized and help further relieve irritation/discomfort.    ED Prescriptions     Medication Sig Dispense Auth. Provider   azithromycin  (ZITHROMAX ) 250 MG tablet Take 1 tablet (250 mg total) by mouth daily. Take first 2 tablets together, then 1 every day until finished. 6 tablet Marlaine Arey R, NP   predniSONE  (STERAPRED UNI-PAK 21 TAB) 10 MG (21) TBPK tablet Take by mouth daily. Take 6 tabs by mouth daily   for 1 days, then 5 tabs for 1 days, then 4 tabs for 1 days, then 3 tabs for 1 days, 2 tabs for 1 days, then 1 tab by mouth daily for 1 days 21 tablet Keonna Raether, Shelba SAUNDERS, NP      PDMP not reviewed this encounter.   Teresa Shelba SAUNDERS, NP 02/21/24 1429

## 2024-02-21 NOTE — Discharge Instructions (Signed)
 Take azithromycin  as directed for bacterial coverage  Begin prednisone  every morning with food to reduce swelling throughout the body and help with pain  You can take Tylenol and/or Ibuprofen as needed for fever reduction and pain relief.   For cough: honey 1/2 to 1 teaspoon (you can dilute the honey in water or another fluid).  You can also use guaifenesin and dextromethorphan for cough. You can use a humidifier for chest congestion and cough.  If you don't have a humidifier, you can sit in the bathroom with the hot shower running.      For sore throat: try warm salt water gargles, cepacol lozenges, throat spray, warm tea or water with lemon/honey, popsicles or ice, or OTC cold relief medicine for throat discomfort.   For congestion: take a daily anti-histamine like Zyrtec , Claritin, and a oral decongestant, such as pseudoephedrine.  You can also use Flonase  1-2 sprays in each nostril daily.   It is important to stay hydrated: drink plenty of fluids (water, gatorade/powerade/pedialyte, juices, or teas) to keep your throat moisturized and help further relieve irritation/discomfort.

## 2024-02-21 NOTE — ED Triage Notes (Signed)
 Patient reports cough with yellow- greenish phlegm and sore throat x 3 days and fatigued. Rate pain 5/10.

## 2024-03-15 ENCOUNTER — Other Ambulatory Visit: Payer: Self-pay

## 2024-06-03 ENCOUNTER — Other Ambulatory Visit: Payer: Self-pay

## 2024-06-03 ENCOUNTER — Other Ambulatory Visit (HOSPITAL_COMMUNITY): Payer: Self-pay

## 2024-06-03 MED ORDER — FLUZONE 0.5 ML IM SUSY
0.5000 mL | PREFILLED_SYRINGE | Freq: Once | INTRAMUSCULAR | 0 refills | Status: AC
Start: 2024-06-03 — End: 2024-06-04
  Filled 2024-06-03: qty 0.5, 1d supply, fill #0

## 2024-06-04 ENCOUNTER — Other Ambulatory Visit: Payer: Self-pay

## 2024-06-07 ENCOUNTER — Other Ambulatory Visit: Payer: Self-pay

## 2024-06-17 ENCOUNTER — Ambulatory Visit: Payer: Self-pay | Admitting: *Deleted

## 2024-06-17 NOTE — Telephone Encounter (Signed)
 FYI Only or Action Required?: FYI only for provider.  Patient was last seen in primary care on 10/29/2023 by Hope Merle, MD.  Called Nurse Triage reporting Dysuria.  Symptoms began several days ago.  Interventions attempted: Nothing.  Symptoms are: gradually worsening.  Triage Disposition: See HCP Within 4 Hours (Or PCP Triage) 4-24 hours  Patient/caregiver understands and will follow disposition?: Yes   Appt scheduled for tomorrow . TOC appt scheduled for 10/19/24             Copied from CRM #[id number]. Topic: Clinical - Red Word Triage >> Jun 17, 2024  8:12 AM Suzen RAMAN wrote: Red Word that prompted transfer to Nurse Triage: painful/burning w/ urination, discomfort and fatigue Reason for Disposition  [1] SEVERE pain with urination (e.g., excruciating) AND [2] not improved after 2 hours of pain medicine    Moderate to severe  Answer Assessment - Initial Assessment Questions Patient needs TOC to other provider. Last OV 10/29/23. No available OV with providers today. Recommended UC or mobile bus and gave location if sx become more severe. Able to schedule patient appt with provider 06/18/24 for acute / same day appt. TOC appt also scheduled 10/19/24.      1. SEVERITY: How bad is the pain?  (e.g., Scale 1-10; mild, moderate, or severe)     Moderate to severe 2. FREQUENCY: How many times have you had painful urination today?      Burning with every urination 3. PATTERN: Is pain present every time you urinate or just sometimes?      Every time 4. ONSET: When did the painful urination start?      Monday 5. FEVER: Do you have a fever? If Yes, ask: What is your temperature, how was it measured, and when did it start?     no 6. PAST UTI: Have you had a urine infection before? If Yes, ask: When was the last time? and What happened that time?      Yes  7. CAUSE: What do you think is causing the painful urination?  (e.g., UTI, scratch, Herpes sore)      UTI 8. OTHER SYMPTOMS: Do you have any other symptoms? (e.g., blood in urine, flank pain, genital sores, urgency, vaginal discharge)     Pain with urination , burning , fatigue  9. PREGNANCY: Is there any chance you are pregnant? When was your last menstrual period?     na  Protocols used: Urination Pain - Female-A-AH

## 2024-06-18 ENCOUNTER — Other Ambulatory Visit: Payer: Self-pay

## 2024-06-18 ENCOUNTER — Other Ambulatory Visit (HOSPITAL_COMMUNITY)
Admission: RE | Admit: 2024-06-18 | Discharge: 2024-06-18 | Disposition: A | Discharge status: Home / Self Care | Source: Ambulatory Visit | Attending: Family | Admitting: Family

## 2024-06-18 ENCOUNTER — Ambulatory Visit: Admitting: Family

## 2024-06-18 ENCOUNTER — Encounter: Payer: Self-pay | Admitting: Family

## 2024-06-18 DIAGNOSIS — R300 Dysuria: Secondary | ICD-10-CM | POA: Insufficient documentation

## 2024-06-18 DIAGNOSIS — R399 Unspecified symptoms and signs involving the genitourinary system: Secondary | ICD-10-CM | POA: Diagnosis not present

## 2024-06-18 LAB — POCT URINALYSIS DIPSTICK
Bilirubin, UA: NEGATIVE
Glucose, UA: NEGATIVE
Ketones, UA: NEGATIVE
Nitrite, UA: NEGATIVE
Protein, UA: NEGATIVE
Spec Grav, UA: 1.01 (ref 1.010–1.025)
Urobilinogen, UA: 0.2 U/dL
pH, UA: 7 (ref 5.0–8.0)

## 2024-06-18 LAB — URINALYSIS, ROUTINE W REFLEX MICROSCOPIC
Bilirubin Urine: NEGATIVE
Ketones, ur: NEGATIVE
Nitrite: NEGATIVE
Specific Gravity, Urine: 1.01 (ref 1.000–1.030)
Total Protein, Urine: NEGATIVE
Urine Glucose: NEGATIVE
Urobilinogen, UA: 0.2 (ref 0.0–1.0)
pH: 7.5 (ref 5.0–8.0)

## 2024-06-18 MED ORDER — NITROFURANTOIN MONOHYD MACRO 100 MG PO CAPS
100.0000 mg | ORAL_CAPSULE | Freq: Two times a day (BID) | ORAL | 0 refills | Status: AC
  Filled 2024-06-18: qty 10, 5d supply, fill #0

## 2024-06-18 NOTE — Progress Notes (Unsigned)
 Assessment & Plan:  Dysuria Assessment & Plan: Afebrile. No systemic features.  UA POC small blood,leukocytes.  Due to duration of symptoms and concern for progression, opted to start macrobid ahead of urine culture. Pending wet prep ( patient self swabbed).  She will let me know how she is doing  Orders: -     Nitrofurantoin Monohyd Macro; Take 1 capsule (100 mg total) by mouth 2 (two) times daily. Take with food.  Dispense: 10 capsule; Refill: 0 -     Cervicovaginal ancillary only  UTI symptoms -     POCT urinalysis dipstick -     Urinalysis, Routine w reflex microscopic; Future -     Urine Culture; Future     Return precautions given.   Risks, benefits, and alternatives of the medications and treatment plan prescribed today were discussed, and patient expressed understanding.   Education regarding symptom management and diagnosis given to patient on AVS either electronically or printed.  No follow-ups on file.  Rollene Northern, FNP  Subjective:    Patient ID: Wanda Davis, female    DOB: 1986/06/27, 38 y.o.   MRN: [id number]  CC: Wanda Davis is a 38 y.o. female who presents today for an acute visit.    HPI: Complains of dysuria x 5 days, waxing and waning.   Endorses unusual odor of urine and darker concentrated color.   She has been drinking 'a lot of water' which has helped.   Symptoms also remind her of having 'BV in the past.'   Denies fever, chills, constipation, abdominal pain, vaginal itching  No recent UTI    LMP 06/04/24 Allergies: Patient has no known allergies. Current Outpatient Medications on File Prior to Visit  Medication Sig Dispense Refill   albuterol  (VENTOLIN  HFA) 108 (90 Base) MCG/ACT inhaler Inhale 2 puffs into the lungs every 6 (six) hours as needed for wheezing or shortness of breath. 6.7 g 11   albuterol  (VENTOLIN  HFA) 108 (90 Base) MCG/ACT inhaler Inhale 2 puffs into the lungs every 6 (six) hours as needed for  wheezing or shortness of breath. 8 g 0   cetirizine  (ZYRTEC ) 10 MG tablet Take 10 mg by mouth daily.     citalopram  (CELEXA ) 40 MG tablet Take 1 tablet (40 mg total) by mouth every morning. 90 tablet 3   cyanocobalamin  (VITAMIN B12) 1000 MCG tablet Take 1 tablet (1,000 mcg total) by mouth daily. 90 tablet 3   fluticasone  (FLONASE ) 50 MCG/ACT nasal spray Place 2 sprays into both nostrils daily. 16 g 6   Norgestimate -Ethinyl Estradiol Triphasic (TRI-VYLIBRA ) 0.18/0.215/0.25 MG-35 MCG tablet Take 1 tablet by mouth daily. 84 tablet 3   triamcinolone  cream (KENALOG ) 0.1 % Apply 1 Application topically 2 (two) times daily. Right hand as needed 454 g 0   Vitamin D , Ergocalciferol , (DRISDOL ) 1.25 MG (50000 UNIT) CAPS capsule Take 1 capsule (50,000 Units total) by mouth every 7 (seven) days. 12 capsule 1   No current facility-administered medications on file prior to visit.    Review of Systems  Constitutional:  Negative for chills and fever.  Respiratory:  Negative for cough.   Cardiovascular:  Negative for chest pain and palpitations.  Gastrointestinal:  Negative for abdominal pain, constipation, nausea and vomiting.  Genitourinary:  Positive for dysuria. Negative for hematuria, vaginal bleeding, vaginal discharge and vaginal pain.      Objective:    There were no vitals taken for this visit.  BP Readings from Last 3 Encounters:  02/21/24 113/74  10/29/23 108/66  04/19/23 115/72   Wt Readings from Last 3 Encounters:  10/29/23 219 lb 2 oz (99.4 kg)  02/24/23 196 lb (88.9 kg)  12/18/22 196 lb 4 oz (89 kg)    Physical Exam Vitals reviewed.  Constitutional:      Appearance: She is well-developed.  Cardiovascular:     Rate and Rhythm: Normal rate and regular rhythm.     Pulses: Normal pulses.     Heart sounds: Normal heart sounds.  Pulmonary:     Effort: Pulmonary effort is normal.     Breath sounds: Normal breath sounds. No wheezing, rhonchi or rales.  Abdominal:     Tenderness:  There is no right CVA tenderness or left CVA tenderness.  Skin:    General: Skin is warm and dry.  Neurological:     Mental Status: She is alert.  Psychiatric:        Speech: Speech normal.        Behavior: Behavior normal.        Thought Content: Thought content normal.

## 2024-06-18 NOTE — Assessment & Plan Note (Signed)
 Afebrile. No systemic features.  UA POC small blood,leukocytes.  Due to duration of symptoms and concern for progression, opted to start macrobid ahead of urine culture. Pending wet prep ( patient self swabbed).  She will let me know how she is doing

## 2024-06-21 ENCOUNTER — Ambulatory Visit: Payer: Self-pay | Admitting: Family

## 2024-06-21 DIAGNOSIS — R899 Unspecified abnormal finding in specimens from other organs, systems and tissues: Secondary | ICD-10-CM

## 2024-06-21 LAB — CERVICOVAGINAL ANCILLARY ONLY
Bacterial Vaginitis (gardnerella): NEGATIVE
Candida Glabrata: NEGATIVE
Candida Vaginitis: NEGATIVE
Chlamydia: NEGATIVE
Comment: NEGATIVE
Comment: NEGATIVE
Comment: NEGATIVE
Comment: NEGATIVE
Comment: NEGATIVE
Comment: NORMAL
Neisseria Gonorrhea: NEGATIVE
Trichomonas: NEGATIVE

## 2024-06-21 LAB — URINE CULTURE
MICRO NUMBER:: 17114476
SPECIMEN QUALITY:: ADEQUATE

## 2024-06-22 ENCOUNTER — Other Ambulatory Visit: Payer: Self-pay | Admitting: Family

## 2024-06-22 DIAGNOSIS — R899 Unspecified abnormal finding in specimens from other organs, systems and tissues: Secondary | ICD-10-CM

## 2024-07-01 ENCOUNTER — Ambulatory Visit: Admitting: Family

## 2024-07-28 ENCOUNTER — Other Ambulatory Visit: Payer: Self-pay

## 2024-07-28 MED ORDER — NORGESTIM-ETH ESTRAD TRIPHASIC 0.18/0.215/0.25 MG-35 MCG PO TABS
1.0000 | ORAL_TABLET | Freq: Every day | ORAL | 3 refills | Status: AC
  Filled 2024-07-28 – 2024-09-06 (×3): qty 84, 84d supply, fill #0

## 2024-08-03 ENCOUNTER — Other Ambulatory Visit: Payer: Self-pay

## 2024-09-06 ENCOUNTER — Other Ambulatory Visit: Payer: Self-pay

## 2024-09-20 ENCOUNTER — Other Ambulatory Visit: Payer: Self-pay

## 2024-09-20 ENCOUNTER — Other Ambulatory Visit: Payer: Self-pay | Admitting: Family

## 2024-09-20 DIAGNOSIS — F39 Unspecified mood [affective] disorder: Secondary | ICD-10-CM

## 2024-09-21 ENCOUNTER — Other Ambulatory Visit: Payer: Self-pay

## 2024-09-21 MED ORDER — CITALOPRAM HYDROBROMIDE 40 MG PO TABS
40.0000 mg | ORAL_TABLET | Freq: Every morning | ORAL | 3 refills | Status: AC
  Filled 2024-09-21: qty 90, 90d supply, fill #0

## 2024-10-19 ENCOUNTER — Encounter: Admitting: Nurse Practitioner
